# Patient Record
Sex: Female | Born: 1980 | Race: White | Hispanic: No | Marital: Married | Smoking: Never smoker
Health system: Southern US, Community
[De-identification: ages and names within clinical notes are randomized; demographics above are authoritative.]

## PROBLEM LIST (undated history)

## (undated) DIAGNOSIS — Z789 Other specified health status: Secondary | ICD-10-CM

## (undated) DIAGNOSIS — Z8619 Personal history of other infectious and parasitic diseases: Secondary | ICD-10-CM

## (undated) DIAGNOSIS — T7840XA Allergy, unspecified, initial encounter: Secondary | ICD-10-CM

## (undated) DIAGNOSIS — F419 Anxiety disorder, unspecified: Secondary | ICD-10-CM

## (undated) HISTORY — DX: Anxiety disorder, unspecified: F41.9

## (undated) HISTORY — DX: Personal history of other infectious and parasitic diseases: Z86.19

## (undated) HISTORY — PX: APPENDECTOMY: SHX54

## (undated) HISTORY — DX: Allergy, unspecified, initial encounter: T78.40XA

## (undated) HISTORY — PX: WISDOM TOOTH EXTRACTION: SHX21

## (undated) HISTORY — PX: TUBAL LIGATION: SHX77

---

## 2008-03-05 ENCOUNTER — Emergency Department (HOSPITAL_COMMUNITY): Admission: EM | Admit: 2008-03-05 | Discharge: 2008-03-05 | Payer: Self-pay | Admitting: Emergency Medicine

## 2011-04-29 LAB — POCT RAPID STREP A: Streptococcus, Group A Screen (Direct): NEGATIVE

## 2011-05-17 LAB — OB RESULTS CONSOLE HEPATITIS B SURFACE ANTIGEN: Hepatitis B Surface Ag: NEGATIVE

## 2011-05-17 LAB — OB RESULTS CONSOLE RPR: RPR: NONREACTIVE

## 2011-05-17 LAB — OB RESULTS CONSOLE RUBELLA ANTIBODY, IGM: Rubella: IMMUNE

## 2011-05-17 LAB — OB RESULTS CONSOLE HIV ANTIBODY (ROUTINE TESTING): HIV: NONREACTIVE

## 2011-05-17 LAB — OB RESULTS CONSOLE ABO/RH: RH Type: POSITIVE

## 2011-09-05 ENCOUNTER — Other Ambulatory Visit: Payer: Self-pay

## 2011-09-05 LAB — OB RESULTS CONSOLE RPR: RPR: NONREACTIVE

## 2011-09-06 ENCOUNTER — Other Ambulatory Visit (HOSPITAL_COMMUNITY): Payer: Self-pay | Admitting: Obstetrics and Gynecology

## 2011-09-06 DIAGNOSIS — Q672 Dolichocephaly: Secondary | ICD-10-CM

## 2011-09-07 ENCOUNTER — Ambulatory Visit (HOSPITAL_COMMUNITY)
Admission: RE | Admit: 2011-09-07 | Discharge: 2011-09-07 | Disposition: A | Discharge status: Home / Self Care | Payer: BC Managed Care – PPO | Source: Ambulatory Visit | Attending: Obstetrics and Gynecology | Admitting: Obstetrics and Gynecology

## 2011-09-07 ENCOUNTER — Encounter (HOSPITAL_COMMUNITY): Payer: Self-pay

## 2011-09-07 DIAGNOSIS — Q672 Dolichocephaly: Secondary | ICD-10-CM

## 2011-09-07 DIAGNOSIS — Z1389 Encounter for screening for other disorder: Secondary | ICD-10-CM | POA: Insufficient documentation

## 2011-09-07 DIAGNOSIS — Z363 Encounter for antenatal screening for malformations: Secondary | ICD-10-CM | POA: Insufficient documentation

## 2011-09-07 DIAGNOSIS — O358XX0 Maternal care for other (suspected) fetal abnormality and damage, not applicable or unspecified: Secondary | ICD-10-CM | POA: Insufficient documentation

## 2011-11-27 ENCOUNTER — Inpatient Hospital Stay (HOSPITAL_COMMUNITY)
Admission: AD | Admit: 2011-11-27 | Discharge: 2011-12-01 | DRG: 371 | Disposition: A | Discharge status: Home / Self Care | Payer: BC Managed Care – PPO | Source: Ambulatory Visit | Attending: Obstetrics & Gynecology | Admitting: Obstetrics & Gynecology

## 2011-11-27 ENCOUNTER — Encounter (HOSPITAL_COMMUNITY): Payer: Self-pay | Admitting: *Deleted

## 2011-11-27 DIAGNOSIS — O99892 Other specified diseases and conditions complicating childbirth: Secondary | ICD-10-CM | POA: Diagnosis present

## 2011-11-27 DIAGNOSIS — Z22330 Carrier of Group B streptococcus: Secondary | ICD-10-CM

## 2011-11-27 HISTORY — DX: Other specified health status: Z78.9

## 2011-11-27 LAB — CBC
HCT: 37.8 % (ref 36.0–46.0)
Hemoglobin: 13.1 g/dL (ref 12.0–15.0)
MCV: 89.6 fL (ref 78.0–100.0)
RBC: 4.22 MIL/uL (ref 3.87–5.11)
RDW: 13.1 % (ref 11.5–15.5)
WBC: 15 10*3/uL — ABNORMAL HIGH (ref 4.0–10.5)

## 2011-11-27 MED ORDER — EPHEDRINE 5 MG/ML INJ
10.0000 mg | INTRAVENOUS | Status: DC | PRN
  Administered 2011-11-28: 10 mg via INTRAVENOUS
  Filled 2011-11-27: qty 4

## 2011-11-27 MED ORDER — PHENYLEPHRINE 40 MCG/ML (10ML) SYRINGE FOR IV PUSH (FOR BLOOD PRESSURE SUPPORT)
80.0000 ug | PREFILLED_SYRINGE | INTRAVENOUS | Status: DC | PRN
Start: 2011-11-27 — End: 2011-11-28

## 2011-11-27 MED ORDER — CITRIC ACID-SODIUM CITRATE 334-500 MG/5ML PO SOLN
30.0000 mL | ORAL | Status: DC | PRN
  Administered 2011-11-28: 30 mL via ORAL
  Filled 2011-11-27: qty 15

## 2011-11-27 MED ORDER — IBUPROFEN 600 MG PO TABS: 600.0000 mg | ORAL_TABLET | Freq: Four times a day (QID) | ORAL | Status: DC | PRN

## 2011-11-27 MED ORDER — PENICILLIN G POTASSIUM 5000000 UNITS IJ SOLR
2.5000 10*6.[IU] | INTRAVENOUS | Status: DC
  Administered 2011-11-28 (×4): 2.5 10*6.[IU] via INTRAVENOUS
  Filled 2011-11-27 (×7): qty 2.5

## 2011-11-27 MED ORDER — FLEET ENEMA 7-19 GM/118ML RE ENEM: 1.0000 | ENEMA | RECTAL | Status: DC | PRN

## 2011-11-27 MED ORDER — ONDANSETRON HCL 4 MG/2ML IJ SOLN: 4.0000 mg | Freq: Four times a day (QID) | INTRAMUSCULAR | Status: DC | PRN

## 2011-11-27 MED ORDER — PENICILLIN G POTASSIUM 5000000 UNITS IJ SOLR
5.0000 10*6.[IU] | Freq: Once | INTRAVENOUS | Status: AC
  Administered 2011-11-27: 5 10*6.[IU] via INTRAVENOUS
  Filled 2011-11-27: qty 5

## 2011-11-27 MED ORDER — LIDOCAINE HCL (PF) 1 % IJ SOLN
30.0000 mL | INTRAMUSCULAR | Status: DC | PRN
  Filled 2011-11-27: qty 30

## 2011-11-27 MED ORDER — LACTATED RINGERS IV SOLN
INTRAVENOUS | Status: DC
  Administered 2011-11-27 – 2011-11-28 (×3): 125 mL/h via INTRAVENOUS
  Administered 2011-11-28 (×2): via INTRAVENOUS

## 2011-11-27 MED ORDER — ACETAMINOPHEN 325 MG PO TABS
650.0000 mg | ORAL_TABLET | ORAL | Status: DC | PRN
  Administered 2011-11-28: 650 mg via ORAL
  Filled 2011-11-27: qty 2

## 2011-11-27 MED ORDER — OXYTOCIN 20 UNITS IN LACTATED RINGERS INFUSION - SIMPLE: 125.0000 mL/h | Freq: Once | INTRAVENOUS | Status: DC

## 2011-11-27 MED ORDER — BUTORPHANOL TARTRATE 2 MG/ML IJ SOLN: 1.0000 mg | INTRAMUSCULAR | Status: DC | PRN

## 2011-11-27 MED ORDER — LIDOCAINE HCL (PF) 1 % IJ SOLN
INTRAMUSCULAR | Status: DC | PRN
  Administered 2011-11-27 (×2): 5 mL

## 2011-11-27 MED ORDER — FENTANYL 2.5 MCG/ML BUPIVACAINE 1/10 % EPIDURAL INFUSION (WH - ANES)
14.0000 mL/h | INTRAMUSCULAR | Status: DC
  Administered 2011-11-27 – 2011-11-28 (×5): 14 mL/h via EPIDURAL
  Filled 2011-11-27 (×5): qty 60

## 2011-11-27 MED ORDER — DIPHENHYDRAMINE HCL 50 MG/ML IJ SOLN: 12.5000 mg | INTRAMUSCULAR | Status: DC | PRN

## 2011-11-27 MED ORDER — OXYTOCIN BOLUS FROM INFUSION
500.0000 mL | Freq: Once | INTRAVENOUS | Status: DC
  Filled 2011-11-27: qty 500

## 2011-11-27 MED ORDER — PHENYLEPHRINE 40 MCG/ML (10ML) SYRINGE FOR IV PUSH (FOR BLOOD PRESSURE SUPPORT)
80.0000 ug | PREFILLED_SYRINGE | INTRAVENOUS | Status: DC | PRN
  Administered 2011-11-28: 80 ug via INTRAVENOUS
  Filled 2011-11-27: qty 5

## 2011-11-27 MED ORDER — OXYCODONE-ACETAMINOPHEN 5-325 MG PO TABS: 1.0000 | ORAL_TABLET | ORAL | Status: DC | PRN

## 2011-11-27 MED ORDER — LACTATED RINGERS IV SOLN
500.0000 mL | INTRAVENOUS | Status: DC | PRN
Start: 2011-11-27 — End: 2011-11-28
  Administered 2011-11-28: 500 mL via INTRAVENOUS

## 2011-11-27 MED ORDER — LACTATED RINGERS IV SOLN
500.0000 mL | Freq: Once | INTRAVENOUS | Status: AC
  Administered 2011-11-27: 500 mL via INTRAVENOUS
  Administered 2011-11-28 (×2): via INTRAVENOUS

## 2011-11-27 MED ORDER — EPHEDRINE 5 MG/ML INJ
10.0000 mg | INTRAVENOUS | Status: DC | PRN
  Administered 2011-11-28: 10 mg via INTRAVENOUS

## 2011-11-27 NOTE — H&P (Signed)
  31 y.o. G1P0  Estimated Date of Delivery: 11/28/11 admitted at 39/[redacted] weeks gestation in labor. Prenatal course was uncomplicated. Prenatal labs: Blood Type:AB+.  Screening tests for HIV, Syphilis, Hepatitis B, Rubella sensitivity, fetal anomalies, and gestational diabetes were negative.  Perineal group B strep colonization was present.    Afebrile, VSS Heart and Lungs: No active disease Abdomen: soft, gravid, EFW AGA. Cervical exam:  3.5/90, Vtx -2 (per admission nurse).  Impression: Term Pregnancy, early labor  Plan:  TOL; GBS prophylaxis

## 2011-11-27 NOTE — Anesthesia Preprocedure Evaluation (Addendum)
Anesthesia Evaluation  Patient identified by MRN, date of birth, ID band Patient awake    Reviewed: Allergy & Precautions, H&P , Patient's Chart, lab work & pertinent test results  Airway Mallampati: II TM Distance: >3 FB Neck ROM: full    Dental No notable dental hx.    Pulmonary neg pulmonary ROS,  breath sounds clear to auscultation  Pulmonary exam normal       Cardiovascular negative cardio ROS  Rhythm:regular Rate:Normal     Neuro/Psych negative neurological ROS  negative psych ROS   GI/Hepatic negative GI ROS, Neg liver ROS,   Endo/Other  negative endocrine ROS  Renal/GU negative Renal ROS     Musculoskeletal   Abdominal   Peds  Hematology negative hematology ROS (+)   Anesthesia Other Findings   Reproductive/Obstetrics (+) Pregnancy                           Anesthesia Physical Anesthesia Plan  ASA: II and Emergent  Anesthesia Plan: Epidural   Post-op Pain Management:    Induction:   Airway Management Planned: Natural Airway  Additional Equipment:   Intra-op Plan:   Post-operative Plan:   Informed Consent: I have reviewed the patients History and Physical, chart, labs and discussed the procedure including the risks, benefits and alternatives for the proposed anesthesia with the patient or authorized representative who has indicated his/her understanding and acceptance.     Plan Discussed with:   Anesthesia Plan Comments:        Anesthesia Quick Evaluation

## 2011-11-27 NOTE — MAU Note (Signed)
Pt reports uc's q 5 min for 4 hours

## 2011-11-27 NOTE — Anesthesia Procedure Notes (Signed)
Epidural Patient location during procedure: OB Start time: 11/27/2011 11:36 PM  Staffing Anesthesiologist: Brayton Caves R Performed by: anesthesiologist   Preanesthetic Checklist Completed: patient identified, site marked, surgical consent, pre-op evaluation, timeout performed, IV checked, risks and benefits discussed and monitors and equipment checked  Epidural Patient position: sitting Prep: site prepped and draped and DuraPrep Patient monitoring: continuous pulse ox and blood pressure Approach: midline Injection technique: LOR air and LOR saline  Needle:  Needle type: Tuohy  Needle gauge: 17 G Needle length: 9 cm Needle insertion depth: 5 cm cm Catheter type: closed end flexible Catheter size: 19 Gauge Catheter at skin depth: 10 cm Test dose: negative  Assessment Events: blood not aspirated, injection not painful, no injection resistance, negative IV test and no paresthesia  Additional Notes Patient identified.  Risk benefits discussed including failed block, incomplete pain control, headache, nerve damage, paralysis, blood pressure changes, nausea, vomiting, reactions to medication both toxic or allergic, and postpartum back pain.  Patient expressed understanding and wished to proceed.  All questions were answered.  Sterile technique used throughout procedure and epidural site dressed with sterile barrier dressing. No paresthesia or other complications noted.The patient did not experience any signs of intravascular injection such as tinnitus or metallic taste in mouth nor signs of intrathecal spread such as rapid motor block. Please see nursing notes for vital signs.

## 2011-11-28 ENCOUNTER — Encounter (HOSPITAL_COMMUNITY)
Admission: AD | Disposition: A | Discharge status: Home / Self Care | Payer: Self-pay | Source: Ambulatory Visit | Attending: Obstetrics & Gynecology

## 2011-11-28 ENCOUNTER — Encounter (HOSPITAL_COMMUNITY): Payer: Self-pay | Admitting: *Deleted

## 2011-11-28 ENCOUNTER — Encounter (HOSPITAL_COMMUNITY): Payer: Self-pay | Admitting: Anesthesiology

## 2011-11-28 ENCOUNTER — Inpatient Hospital Stay (HOSPITAL_COMMUNITY): Payer: BC Managed Care – PPO | Admitting: Anesthesiology

## 2011-11-28 SURGERY — Surgical Case
Anesthesia: Epidural | Site: Abdomen | Wound class: Clean Contaminated

## 2011-11-28 MED ORDER — OXYCODONE-ACETAMINOPHEN 5-325 MG PO TABS
1.0000 | ORAL_TABLET | ORAL | Status: DC | PRN
  Administered 2011-11-30 – 2011-12-01 (×3): 1 via ORAL
  Filled 2011-11-28 (×3): qty 1

## 2011-11-28 MED ORDER — MEPERIDINE HCL 25 MG/ML IJ SOLN
6.2500 mg | INTRAMUSCULAR | Status: DC | PRN
  Administered 2011-11-28: 6.25 mg via INTRAVENOUS

## 2011-11-28 MED ORDER — FENTANYL CITRATE 0.05 MG/ML IJ SOLN: 25.0000 ug | INTRAMUSCULAR | Status: DC | PRN

## 2011-11-28 MED ORDER — NALOXONE HCL 0.4 MG/ML IJ SOLN: 0.4000 mg | INTRAMUSCULAR | Status: DC | PRN

## 2011-11-28 MED ORDER — ONDANSETRON HCL 4 MG/2ML IJ SOLN: 4.0000 mg | Freq: Three times a day (TID) | INTRAMUSCULAR | Status: DC | PRN

## 2011-11-28 MED ORDER — DIPHENHYDRAMINE HCL 50 MG/ML IJ SOLN: 12.5000 mg | INTRAMUSCULAR | Status: DC | PRN

## 2011-11-28 MED ORDER — NALBUPHINE HCL 10 MG/ML IJ SOLN: 5.0000 mg | INTRAMUSCULAR | Status: DC | PRN

## 2011-11-28 MED ORDER — OXYTOCIN 20 UNITS IN LACTATED RINGERS INFUSION - SIMPLE: 125.0000 mL/h | INTRAVENOUS | Status: AC

## 2011-11-28 MED ORDER — SODIUM BICARBONATE 8.4 % IV SOLN
INTRAVENOUS | Status: DC | PRN
  Administered 2011-11-28: 5 mL via EPIDURAL

## 2011-11-28 MED ORDER — SODIUM BICARBONATE 8.4 % IV SOLN
INTRAVENOUS | Status: AC
  Filled 2011-11-28: qty 50

## 2011-11-28 MED ORDER — ONDANSETRON HCL 4 MG/2ML IJ SOLN
INTRAMUSCULAR | Status: DC | PRN
  Administered 2011-11-28: 4 mg via INTRAVENOUS

## 2011-11-28 MED ORDER — SENNOSIDES-DOCUSATE SODIUM 8.6-50 MG PO TABS
2.0000 | ORAL_TABLET | Freq: Every day | ORAL | Status: DC
  Administered 2011-11-28 – 2011-11-30 (×3): 2 via ORAL

## 2011-11-28 MED ORDER — PRENATAL MULTIVITAMIN CH
1.0000 | ORAL_TABLET | Freq: Every day | ORAL | Status: DC
  Administered 2011-11-29 – 2011-12-01 (×3): 1 via ORAL
  Filled 2011-11-28 (×3): qty 1

## 2011-11-28 MED ORDER — METOCLOPRAMIDE HCL 5 MG/ML IJ SOLN: 10.0000 mg | Freq: Three times a day (TID) | INTRAMUSCULAR | Status: DC | PRN

## 2011-11-28 MED ORDER — MORPHINE SULFATE 0.5 MG/ML IJ SOLN
INTRAMUSCULAR | Status: AC
  Filled 2011-11-28: qty 10

## 2011-11-28 MED ORDER — MEPERIDINE HCL 25 MG/ML IJ SOLN
INTRAMUSCULAR | Status: AC
  Filled 2011-11-28: qty 1

## 2011-11-28 MED ORDER — ONDANSETRON HCL 4 MG/2ML IJ SOLN: 4.0000 mg | INTRAMUSCULAR | Status: DC | PRN

## 2011-11-28 MED ORDER — KETOROLAC TROMETHAMINE 30 MG/ML IJ SOLN
30.0000 mg | Freq: Four times a day (QID) | INTRAMUSCULAR | Status: AC | PRN
  Administered 2011-11-28: 30 mg via INTRAVENOUS

## 2011-11-28 MED ORDER — LIDOCAINE-EPINEPHRINE (PF) 2 %-1:200000 IJ SOLN
INTRAMUSCULAR | Status: AC
  Filled 2011-11-28: qty 20

## 2011-11-28 MED ORDER — MENTHOL 3 MG MT LOZG: 1.0000 | LOZENGE | OROMUCOSAL | Status: DC | PRN

## 2011-11-28 MED ORDER — SODIUM CHLORIDE 0.9 % IJ SOLN: 3.0000 mL | INTRAMUSCULAR | Status: DC | PRN

## 2011-11-28 MED ORDER — KETOROLAC TROMETHAMINE 30 MG/ML IJ SOLN
INTRAMUSCULAR | Status: AC
  Filled 2011-11-28: qty 1

## 2011-11-28 MED ORDER — KETOROLAC TROMETHAMINE 30 MG/ML IJ SOLN: 30.0000 mg | Freq: Four times a day (QID) | INTRAMUSCULAR | Status: AC | PRN

## 2011-11-28 MED ORDER — SCOPOLAMINE 1 MG/3DAYS TD PT72
1.0000 | MEDICATED_PATCH | Freq: Once | TRANSDERMAL | Status: DC
  Administered 2011-11-28: 1.5 mg via TRANSDERMAL

## 2011-11-28 MED ORDER — TETANUS-DIPHTH-ACELL PERTUSSIS 5-2.5-18.5 LF-MCG/0.5 IM SUSP
0.5000 mL | Freq: Once | INTRAMUSCULAR | Status: AC
  Administered 2011-11-30: 0.5 mL via INTRAMUSCULAR
  Filled 2011-11-28: qty 0.5

## 2011-11-28 MED ORDER — DIPHENHYDRAMINE HCL 50 MG/ML IJ SOLN: 25.0000 mg | INTRAMUSCULAR | Status: DC | PRN

## 2011-11-28 MED ORDER — OXYTOCIN 20 UNITS IN LACTATED RINGERS INFUSION - SIMPLE
INTRAVENOUS | Status: AC
  Filled 2011-11-28: qty 1000

## 2011-11-28 MED ORDER — TERBUTALINE SULFATE 1 MG/ML IJ SOLN: 0.2500 mg | Freq: Once | INTRAMUSCULAR | Status: DC | PRN

## 2011-11-28 MED ORDER — LACTATED RINGERS IV SOLN
INTRAVENOUS | Status: DC
  Administered 2011-11-29 (×2): via INTRAVENOUS

## 2011-11-28 MED ORDER — CEFAZOLIN SODIUM-DEXTROSE 2-3 GM-% IV SOLR
2.0000 g | Freq: Once | INTRAVENOUS | Status: AC
  Administered 2011-11-28 (×2): 2 g via INTRAVENOUS
  Filled 2011-11-28: qty 50

## 2011-11-28 MED ORDER — SIMETHICONE 80 MG PO CHEW
80.0000 mg | CHEWABLE_TABLET | Freq: Three times a day (TID) | ORAL | Status: DC
  Administered 2011-11-28 – 2011-12-01 (×10): 80 mg via ORAL

## 2011-11-28 MED ORDER — DIBUCAINE 1 % RE OINT: 1.0000 "application " | TOPICAL_OINTMENT | RECTAL | Status: DC | PRN

## 2011-11-28 MED ORDER — WITCH HAZEL-GLYCERIN EX PADS: 1.0000 "application " | MEDICATED_PAD | CUTANEOUS | Status: DC | PRN

## 2011-11-28 MED ORDER — ZOLPIDEM TARTRATE 5 MG PO TABS: 5.0000 mg | ORAL_TABLET | Freq: Every evening | ORAL | Status: DC | PRN

## 2011-11-28 MED ORDER — IBUPROFEN 600 MG PO TABS: 600.0000 mg | ORAL_TABLET | Freq: Four times a day (QID) | ORAL | Status: DC | PRN

## 2011-11-28 MED ORDER — ONDANSETRON HCL 4 MG PO TABS: 4.0000 mg | ORAL_TABLET | ORAL | Status: DC | PRN

## 2011-11-28 MED ORDER — MORPHINE SULFATE (PF) 0.5 MG/ML IJ SOLN
INTRAMUSCULAR | Status: DC | PRN
  Administered 2011-11-28: 1 mg via INTRAVENOUS

## 2011-11-28 MED ORDER — MORPHINE SULFATE (PF) 0.5 MG/ML IJ SOLN
INTRAMUSCULAR | Status: DC | PRN
  Administered 2011-11-28: 4 mg via EPIDURAL

## 2011-11-28 MED ORDER — OXYTOCIN 10 UNIT/ML IJ SOLN
INTRAMUSCULAR | Status: DC | PRN
  Administered 2011-11-28: 20 [IU] via INTRAMUSCULAR

## 2011-11-28 MED ORDER — ONDANSETRON HCL 4 MG/2ML IJ SOLN
INTRAMUSCULAR | Status: AC
  Filled 2011-11-28: qty 2

## 2011-11-28 MED ORDER — LANOLIN HYDROUS EX OINT: 1.0000 "application " | TOPICAL_OINTMENT | CUTANEOUS | Status: DC | PRN

## 2011-11-28 MED ORDER — OXYTOCIN 20 UNITS IN LACTATED RINGERS INFUSION - SIMPLE
1.0000 m[IU]/min | INTRAVENOUS | Status: DC
  Administered 2011-11-28: 2 m[IU]/min via INTRAVENOUS
  Filled 2011-11-28: qty 1000

## 2011-11-28 MED ORDER — OXYTOCIN 10 UNIT/ML IJ SOLN
INTRAMUSCULAR | Status: AC
  Filled 2011-11-28: qty 4

## 2011-11-28 MED ORDER — DIPHENHYDRAMINE HCL 25 MG PO CAPS: 25.0000 mg | ORAL_CAPSULE | ORAL | Status: DC | PRN

## 2011-11-28 MED ORDER — SODIUM CHLORIDE 0.9 % IV SOLN: 1.0000 ug/kg/h | INTRAVENOUS | Status: DC | PRN

## 2011-11-28 MED ORDER — SIMETHICONE 80 MG PO CHEW: 80.0000 mg | CHEWABLE_TABLET | ORAL | Status: DC | PRN

## 2011-11-28 MED ORDER — SCOPOLAMINE 1 MG/3DAYS TD PT72
MEDICATED_PATCH | TRANSDERMAL | Status: AC
  Filled 2011-11-28: qty 1

## 2011-11-28 MED ORDER — IBUPROFEN 600 MG PO TABS
600.0000 mg | ORAL_TABLET | Freq: Four times a day (QID) | ORAL | Status: DC
  Administered 2011-11-29 – 2011-12-01 (×10): 600 mg via ORAL
  Filled 2011-11-28 (×10): qty 1

## 2011-11-28 MED ORDER — CITRIC ACID-SODIUM CITRATE 334-500 MG/5ML PO SOLN: 30.0000 mL | Freq: Once | ORAL | Status: DC

## 2011-11-28 MED ORDER — DIPHENHYDRAMINE HCL 25 MG PO CAPS: 25.0000 mg | ORAL_CAPSULE | Freq: Four times a day (QID) | ORAL | Status: DC | PRN

## 2011-11-28 SURGICAL SUPPLY — 38 items
CLOTH BEACON ORANGE TIMEOUT ST (SAFETY) ×2 IMPLANT
DRESSING TELFA 8X3 (GAUZE/BANDAGES/DRESSINGS) IMPLANT
DRSG COVADERM 4X10 (GAUZE/BANDAGES/DRESSINGS) ×2 IMPLANT
ELECT REM PT RETURN 9FT ADLT (ELECTROSURGICAL) ×2
ELECTRODE REM PT RTRN 9FT ADLT (ELECTROSURGICAL) ×1 IMPLANT
EXTRACTOR VACUUM M CUP 4 TUBE (SUCTIONS) IMPLANT
GAUZE SPONGE 4X4 12PLY STRL LF (GAUZE/BANDAGES/DRESSINGS) ×2 IMPLANT
GLOVE BIO SURGEON STRL SZ7.5 (GLOVE) ×4 IMPLANT
GLOVE BIOGEL PI IND STRL 7.0 (GLOVE) ×1 IMPLANT
GLOVE BIOGEL PI IND STRL 8 (GLOVE) ×1 IMPLANT
GLOVE BIOGEL PI INDICATOR 7.0 (GLOVE) ×1
GLOVE BIOGEL PI INDICATOR 8 (GLOVE) ×1
GLOVE SS BIOGEL STRL SZ 7 (GLOVE) ×1 IMPLANT
GLOVE SUPERSENSE BIOGEL SZ 7 (GLOVE) ×1
GLOVE SURG SS PI 7.5 STRL IVOR (GLOVE) ×2 IMPLANT
GLOVE SURG SS PI 8.0 STRL IVOR (GLOVE) ×2 IMPLANT
GOWN PREVENTION PLUS LG XLONG (DISPOSABLE) ×4 IMPLANT
GOWN PREVENTION PLUS XLARGE (GOWN DISPOSABLE) ×4 IMPLANT
KIT ABG SYR 3ML LUER SLIP (SYRINGE) IMPLANT
NEEDLE HYPO 25X5/8 SAFETYGLIDE (NEEDLE) IMPLANT
NS IRRIG 1000ML POUR BTL (IV SOLUTION) ×2 IMPLANT
PACK C SECTION WH (CUSTOM PROCEDURE TRAY) ×2 IMPLANT
PAD ABD 7.5X8 STRL (GAUZE/BANDAGES/DRESSINGS) IMPLANT
SLEEVE SCD COMPRESS KNEE MED (MISCELLANEOUS) ×2 IMPLANT
STAPLER VISISTAT 35W (STAPLE) ×2 IMPLANT
SUT PLAIN 1 NONE 54 (SUTURE) ×2 IMPLANT
SUT PROLENE 0 TP 1 60 (SUTURE) IMPLANT
SUT VIC AB 0 CT1 27 (SUTURE) ×2
SUT VIC AB 0 CT1 27XBRD ANBCTR (SUTURE) ×2 IMPLANT
SUT VIC AB 1 CTX 36 (SUTURE) ×4
SUT VIC AB 1 CTX36XBRD ANBCTRL (SUTURE) ×4 IMPLANT
SUT VIC AB 3-0 SH 27 (SUTURE)
SUT VIC AB 3-0 SH 27X BRD (SUTURE) IMPLANT
SUT VICRYL 4-0 PS2 18IN ABS (SUTURE) ×2 IMPLANT
TAPE CLOTH SURG 4X10 WHT LF (GAUZE/BANDAGES/DRESSINGS) ×2 IMPLANT
TOWEL OR 17X24 6PK STRL BLUE (TOWEL DISPOSABLE) ×4 IMPLANT
TRAY FOLEY CATH 14FR (SET/KITS/TRAYS/PACK) ×2 IMPLANT
WATER STERILE IRR 1000ML POUR (IV SOLUTION) ×2 IMPLANT

## 2011-11-28 NOTE — Anesthesia Postprocedure Evaluation (Signed)
Anesthesia Post Note  Patient: Stacy Burton  Procedure(s) Performed: Procedure(s) (LRB): CESAREAN SECTION (N/A)  Anesthesia type: Epidural  Patient location: pacu   Post pain: Pain level controlled  Post assessment: Post-op Vital signs reviewed  Last Vitals:  Filed Vitals:   11/28/11 1900  BP:   Pulse:   Temp: 37.2 C  Resp:     Post vital signs: Reviewed  Level of consciousness: awake  Complications: No apparent anesthesia complications

## 2011-11-28 NOTE — Progress Notes (Addendum)
Dr. Jackson notified of maternal BP. Orders received. Will continue to monitor.  

## 2011-11-28 NOTE — Progress Notes (Signed)
Dr. Arlyce Dice notified of pt status, UC pattern, and SVE. Orders received. Will continue to monitor.

## 2011-11-28 NOTE — Progress Notes (Signed)
FH reassuring.  Cx to me 3-4 and vtx does not fit Cx well.  Vtx at -1-2.  AROM, clear fluid, and IUPC placed to appropriately dose pitocin and monitor labor.

## 2011-11-28 NOTE — Progress Notes (Signed)
Has been pushing about 1 3/4 hours and caput at +2+3 but vtx at best +1.  Fh reassuring.  Patient is exhausted and wants C/section for delivery and based on exam, may be best choice.  I cannot attempt a VE because of vtx at +1.  Have posted C/section and am waiting on OR.  Pitocin off.

## 2011-11-28 NOTE — Transfer of Care (Signed)
Immediate Anesthesia Transfer of Care Note  Patient: Stacy Burton  Procedure(s) Performed: Procedure(s) (LRB): CESAREAN SECTION (N/A)  Patient Location: PACU  Anesthesia Type: Epidural  Level of Consciousness: awake  Airway & Oxygen Therapy: Patient Spontanous Breathing  Post-op Assessment: Report given to PACU RN and Post -op Vital signs reviewed and stable  Post vital signs: stable  Complications: No apparent anesthesia complications

## 2011-11-28 NOTE — Op Note (Addendum)
Preoperative diagnoses-  #1-term pregnancy, active labor, arrest in second stage of labor  Postop diagnoses-  Same plus delivery of viable female infant, weight pending, Apgars 9 and 9.  Procedure-  Primary low transverse cesarean section  Surgeon-Dr. Aldona Bar  Asst.-Dr. Henderson Cloud  Anesthesia-augmented epidural  History - this 31 year old primigravida at term was admitted on the evening of 4/28 in early labor and had a somewhat prolonged latent phase. Pitocin was begun in the early morning of 4-29 and when I assumed her care she was about 3-4 cm dilated with the vertex at -1 to -2 station. She underwent amniotomy with placement of a IUPC and Pitocin was continued. Thereafter she progressed and became fully dilated at approximately 3 PM on 4-29. The vertex at that time was still at about 0 station. She pushed for almost 2 hours and with pushing there was a significant caput noted that was palpable at +2 to +3 station although the vertex was no lower than +1 station. The patient at this time was becoming very exhausted and her pushing efforts were becoming very difficult for her. The presentation of the baby prohibited an operative vaginal delivery and the patient ultimately because of maternal exhaustion requested delivery by cesarean section which I concured with as I believed was going to come to C-section even if she pushed another hour because of nondecent of the vertex. C-section was scheduled and patient did receive 2 g of Ancef IV preoperatively.  Operative procedure-once in the operating room with the epidural  augmented and a Foley catheter  already in place, a timeout was carried out and thereafter patient was prepped and draped in the usual fashion and procedure was begun.  A Pfannenstiel incision was made and with minimal difficulty dissected down to the fascia which was also incised in low transverse fashion. Hemostasis was created at each layer. Subfascial space was created inferiorly  and superiorly muscle separated in midline and peritoneum was identified and entered with care taken to avoid the bowel superiorly the bladder inferiorly. At this time the bladder blade was placed and the vesicouterine peritoneum was incised in low transverse fashion and incision into the uterus in low transverse fashion was then made with the Metzenbaum scissors and extended laterally with fingers. As expected, the vertex was deep in the pelvis but was able to be lifted out and ultimately a viable female infant was delivered which cried spontaneously once after the cord was clamped and cut it was passed off to the waiting team. Apgars were noted to be 9 and 9. Weight is still pending. The baby was brought back to bond with the mother and father in the operating room while the operative procedure was being continued.  The placenta was delivered intact after cord bloods were collected and thereafter the cavity was rendered free of any remaining products of conception. Good uterine contractility was affordable slowly given intravenous Pitocin and manual stimulation.  The uterus was not exteriorized. The uterine incision was carefully looked at and there was an extension down almost to the cervix on the patient's right which was closed using #1 Vicryl in a running locking fashion. Once this extension was closed it was fairly easy to thereafter closed the major part of the incision in the usual fashion of 2 layers-the first being a running locking #1 Vicryl and the second being an imbricating #1 Vicryl. Incision was carefully inspected noted to be dry. The uterus is well contracted.    At this time the abdomen was  lavaged all free blood and clot. The urine was noted to be clearing-there was some blood in the urine just after the delivery.  All counts were correct at this time and no foreign bodies were noted to be remaining in the abdominal cavity.  At this time closure of the abdomen was begun in layers. The  abdominal peritoneum was closed with 0 Vicryl in a running fashion and muscles were secured the same. Assured of good subfascial hemostasis the fascia was then reapproximated with 0 Vicryl from angle to midline bilaterally. Subcutaneous tissue was then rendered hemostatic and skin was closed with staples. A sterile pressure dressing was applied and the patient was ultimately taken to the recovery room in good condition having tolerated the procedure well.  Counts correct again.  In summary, this patient developed an arrest of the second stage and was taken to the operating room for cesarean section for delivery. A viable female infant with Apgars of 9 and 9 was delivered-weight still pending-and at the conclusion of the procedure both mother and baby were doing well in the respective recovery areas.

## 2011-11-29 ENCOUNTER — Encounter (HOSPITAL_COMMUNITY): Payer: Self-pay | Admitting: Obstetrics & Gynecology

## 2011-11-29 LAB — CBC
HCT: 29 % — ABNORMAL LOW (ref 36.0–46.0)
Hemoglobin: 9.8 g/dL — ABNORMAL LOW (ref 12.0–15.0)
MCH: 30.7 pg (ref 26.0–34.0)
MCHC: 33.8 g/dL (ref 30.0–36.0)
MCV: 90.9 fL (ref 78.0–100.0)
RBC: 3.19 MIL/uL — ABNORMAL LOW (ref 3.87–5.11)

## 2011-11-29 NOTE — Anesthesia Postprocedure Evaluation (Signed)
  Anesthesia Post-op Note  Patient: Stacy Burton  Procedure(s) Performed: Procedure(s) (LRB): CESAREAN SECTION (N/A)  Patient Location: PACU and Mother/Baby  Anesthesia Type: Epidural  Level of Consciousness: awake, alert  and oriented  Airway and Oxygen Therapy: Patient Spontanous Breathing  Post-op Pain: mild  Post-op Assessment: Post-op Vital signs reviewed  Post-op Vital Signs: Reviewed and stable  Complications: No apparent anesthesia complications

## 2011-11-29 NOTE — Addendum Note (Signed)
Addendum  created 11/29/11 1156 by Armanda Heritage, RN   Modules edited:Notes Section

## 2011-11-29 NOTE — Progress Notes (Signed)
Post Op Day 1 Subjective: no complaints  Objective: Blood pressure 95/56, pulse 78, temperature 99.4 F (37.4 C), temperature source Oral, resp. rate 18, height 5\' 10"  (1.778 m), weight 211 lb 12.8 oz (96.072 kg), breastfeeding.  Physical Exam:  General: alert Lochia: appropriate Uterine Fundus: firm Incision: no significant drainage   Basename 11/29/11 0530 11/27/11 2255  HGB 9.8* 13.1  HCT 29.0* 37.8    Assessment/Plan: Doing well.  Continue hospital observation   LOS: 2 days   Armistead Sult D 11/29/2011, 9:06 AM

## 2011-11-30 NOTE — Progress Notes (Signed)
Subjective: Postpartum Day 1: Cesarean Delivery Patient reports incisional pain, tolerating PO and + flatus.    Objective: Vital signs in last 24 hours: Filed Vitals:   11/29/11 1434 11/29/11 1815 11/29/11 2134 11/30/11 0530  BP: 95/57 103/60 105/61 105/68  Pulse: 76 71 89 78  Temp: 98.2 F (36.8 C) 98.2 F (36.8 C) 98 F (36.7 C) 98.4 F (36.9 C)  TempSrc: Oral Oral Oral Oral  Resp: 18 18 18 18   Height:      Weight:      SpO2: 96% 98%      Physical Exam:  General: alert, cooperative and appears stated age 31: appropriate Uterine Fundus: firm Incision: healing well DVT Evaluation: No evidence of DVT seen on physical exam.   Basename 11/29/11 0530 11/27/11 2255  HGB 9.8* 13.1  HCT 29.0* 37.8    Assessment/Plan: Status post Cesarean section. Doing well postoperatively.  Continue current care.   Toma Erichsen H. 11/30/2011, 8:33 AM

## 2011-11-30 NOTE — Anesthesia Postprocedure Evaluation (Signed)
  Anesthesia Post-op Note  Patient: Stacy Burton  Procedure(s) Performed: Procedure(s) (LRB): CESAREAN SECTION (N/A)  Patient Location: PACU  Anesthesia Type: Spinal  Level of Consciousness: awake, alert  and oriented  Airway and Oxygen Therapy: Patient Spontanous Breathing  Post-op Pain: none  Post-op Assessment: Post-op Vital signs reviewed and Patient's Cardiovascular Status Stable  Post-op Vital Signs: Reviewed and stable  Complications: No apparent anesthesia complications

## 2011-11-30 NOTE — Addendum Note (Signed)
Addendum  created 11/30/11 1358 by Orlie Pollen, CRNA   Modules edited:Notes Section

## 2011-12-01 MED ORDER — OXYCODONE-ACETAMINOPHEN 5-325 MG PO TABS: 1.0000 | ORAL_TABLET | ORAL | Status: AC | PRN

## 2011-12-01 MED ORDER — IBUPROFEN 600 MG PO TABS: 600.0000 mg | ORAL_TABLET | Freq: Four times a day (QID) | ORAL | Status: AC

## 2011-12-01 NOTE — Discharge Summary (Signed)
Obstetric Discharge Summary Reason for Admission: onset of labor Prenatal Procedures: none Intrapartum Procedures: cesarean: low cervical, transverse Postpartum Procedures: none Complications-Operative and Postpartum: none Hemoglobin  Date Value Range Status  11/29/2011 9.8* 12.0-15.0 (g/dL) Final     REPEATED TO VERIFY     DELTA CHECK NOTED     HCT  Date Value Range Status  11/29/2011 29.0* 36.0-46.0 (%) Final    Physical Exam:  General: alert and cooperative Lochia: appropriate Uterine Fundus: firm Incision: healing well, no significant drainage, no dehiscence, no significant erythema DVT Evaluation: No evidence of DVT seen on physical exam.  Discharge Diagnoses: Term Pregnancy-delivered  Discharge Information: Date: 12/01/2011 Activity: pelvic rest Diet: routine Medications: PNV, Ibuprofen and Percocet Condition: stable Instructions: refer to practice specific booklet Discharge to: home Follow-up Information    Follow up with Caprice Beaver, MD in 4 weeks.   Contact information:   [address] [place] [address] [phone]          Newborn Data: Live born female  Birth Weight: 8 lb 7.5 oz (3840 g) APGAR: 9, 9  Home with mother.  Philip Aspen 12/01/2011, 9:27 AM

## 2011-12-06 ENCOUNTER — Other Ambulatory Visit: Payer: Self-pay | Admitting: Obstetrics and Gynecology

## 2011-12-06 DIAGNOSIS — N63 Unspecified lump in unspecified breast: Secondary | ICD-10-CM

## 2011-12-09 ENCOUNTER — Ambulatory Visit
Admission: RE | Admit: 2011-12-09 | Discharge: 2011-12-09 | Disposition: A | Discharge status: Home / Self Care | Payer: BC Managed Care – PPO | Source: Ambulatory Visit | Attending: Obstetrics and Gynecology | Admitting: Obstetrics and Gynecology

## 2011-12-09 DIAGNOSIS — N63 Unspecified lump in unspecified breast: Secondary | ICD-10-CM

## 2012-10-09 IMAGING — US US OB DETAIL+14 WK
1 series · 14 of 28 positions shown · non-contrast
Comparison: none

[Series 1: us ob detail+14 wk · 0.19mm/px · 14 of 96 slices shown]
[im 4/96]
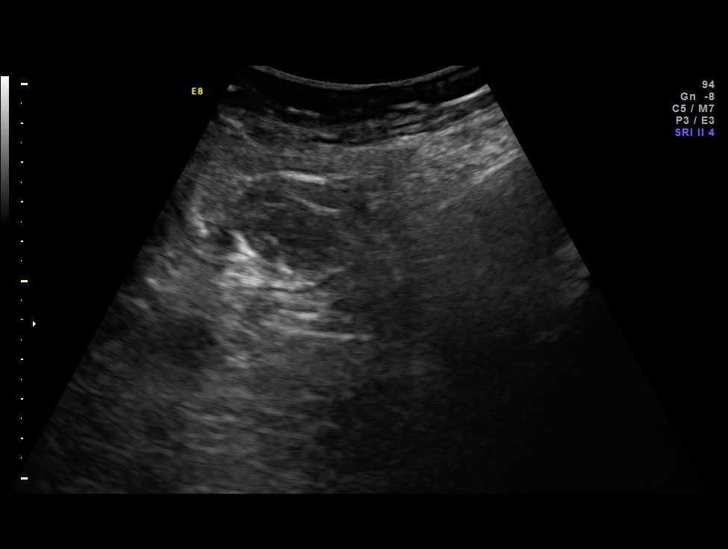
[im 11/96]
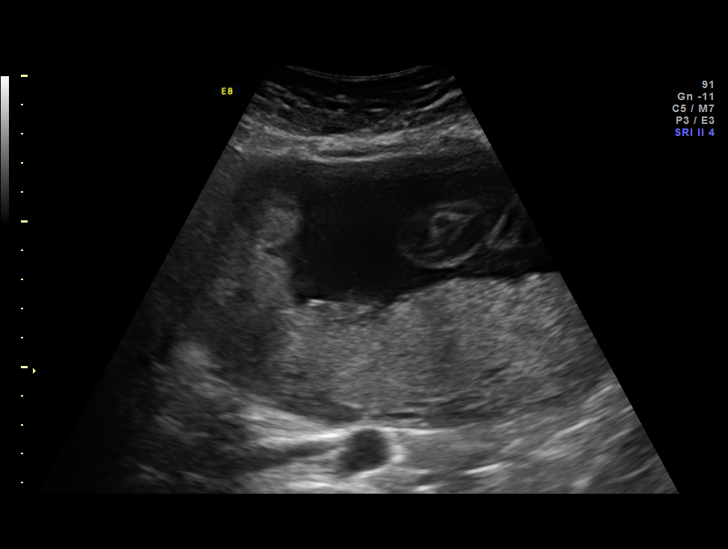
[im 18/96]
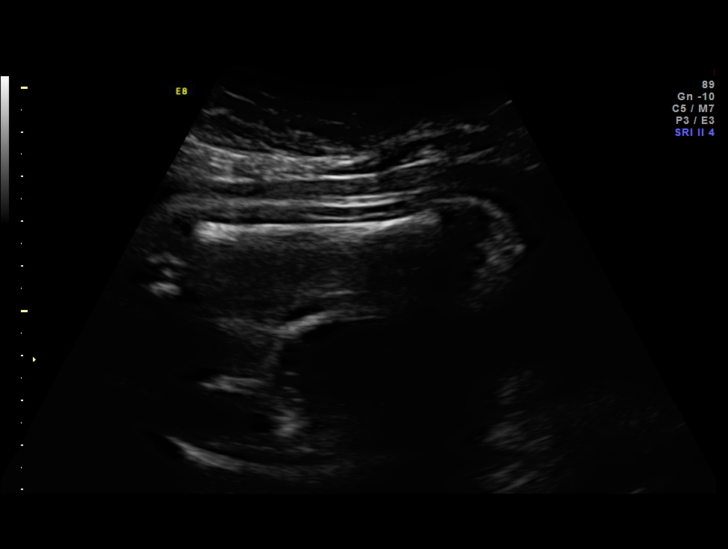
[im 25/96]
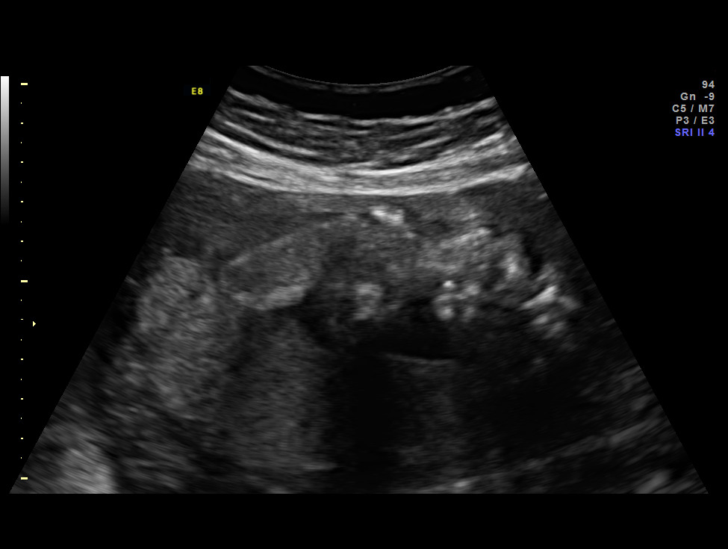
[im 32/96]
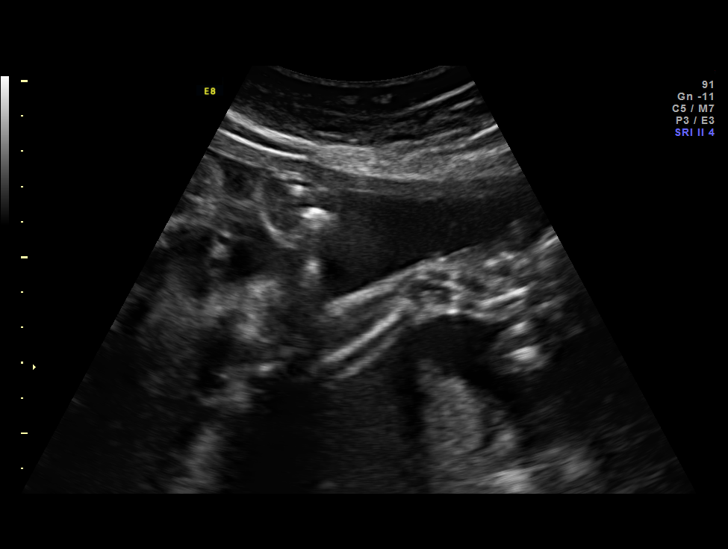
[im 39/96]
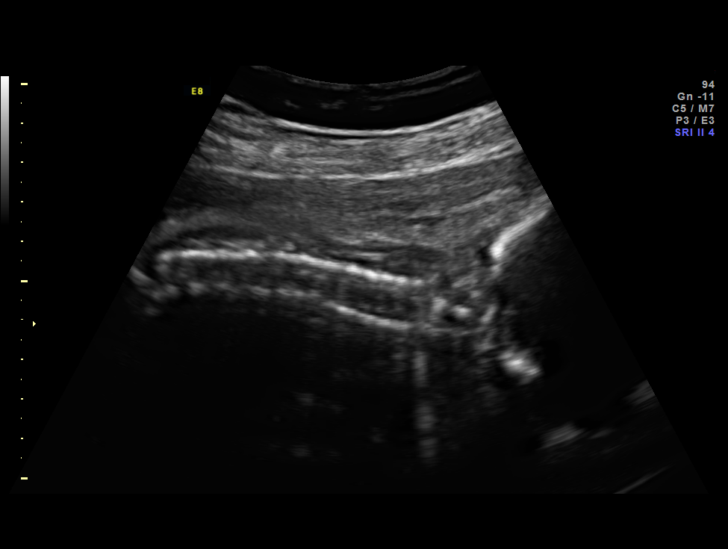
[im 46/96]
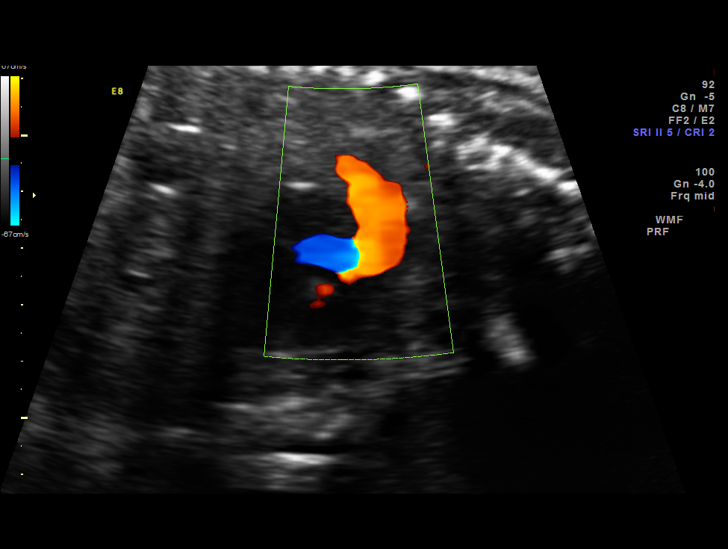
[im 53/96]
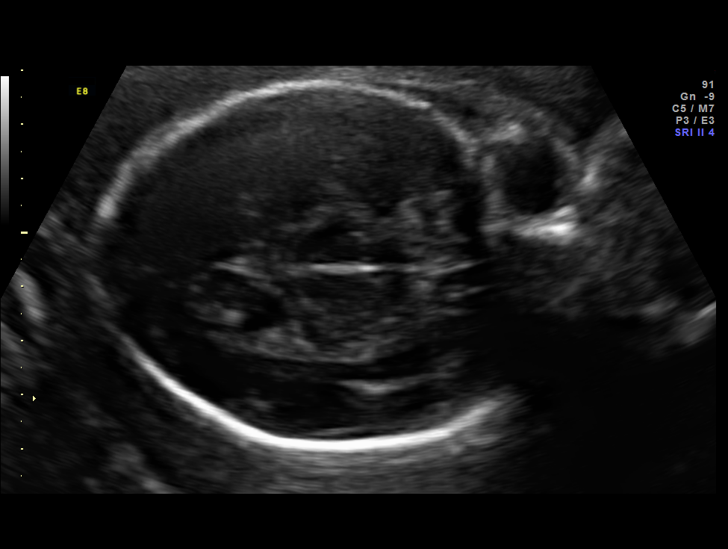
[im 60/96]
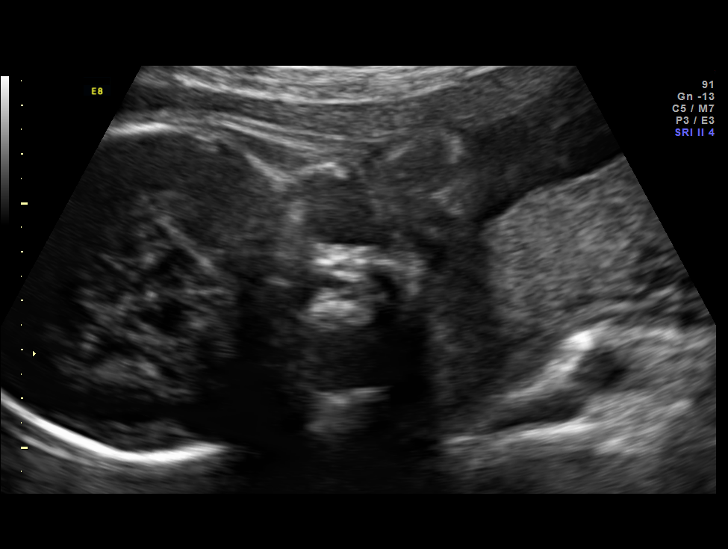
[im 67/96]
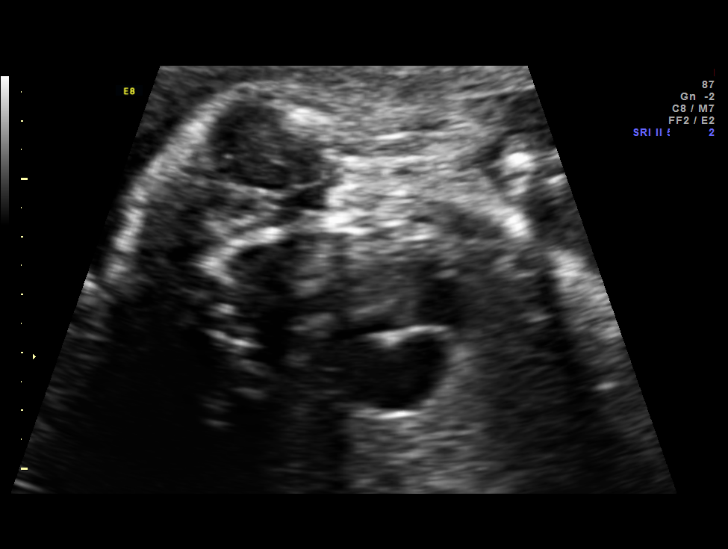
[im 74/96]
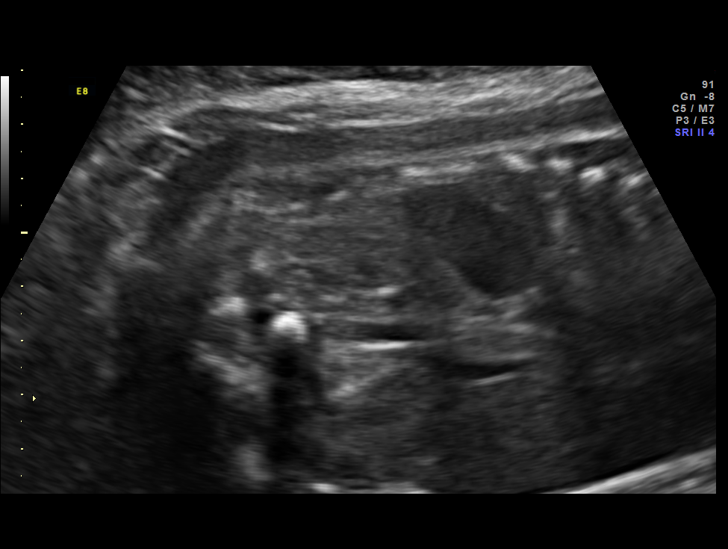
[im 81/96]
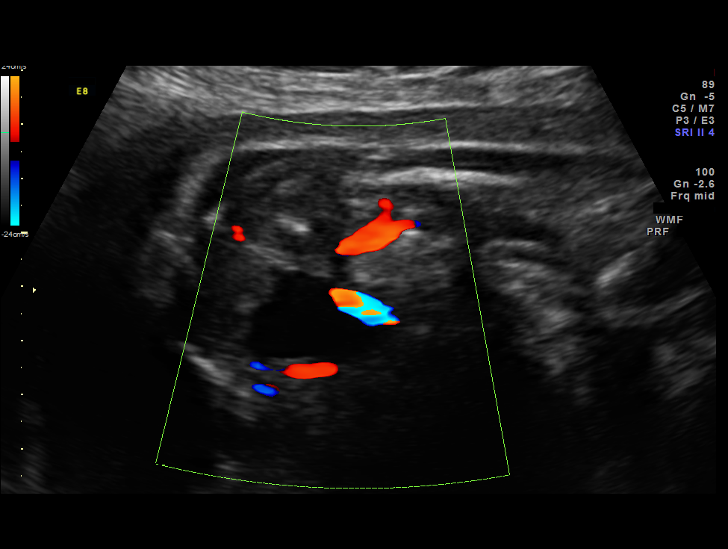
[im 88/96]
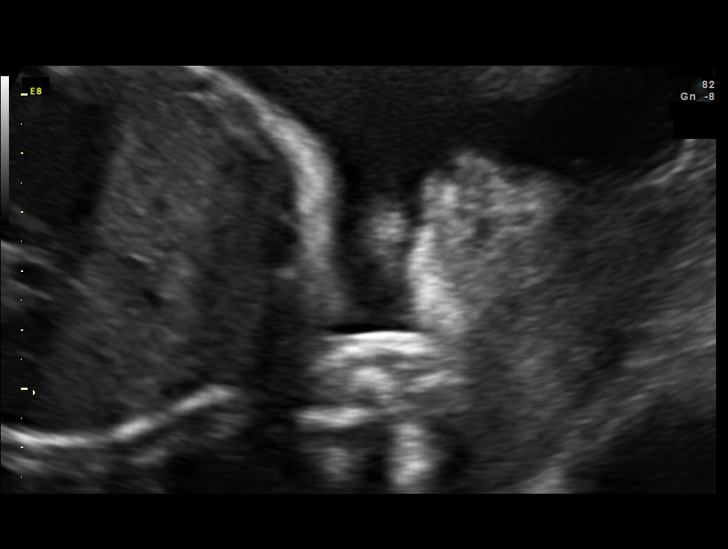
[im 96/96]
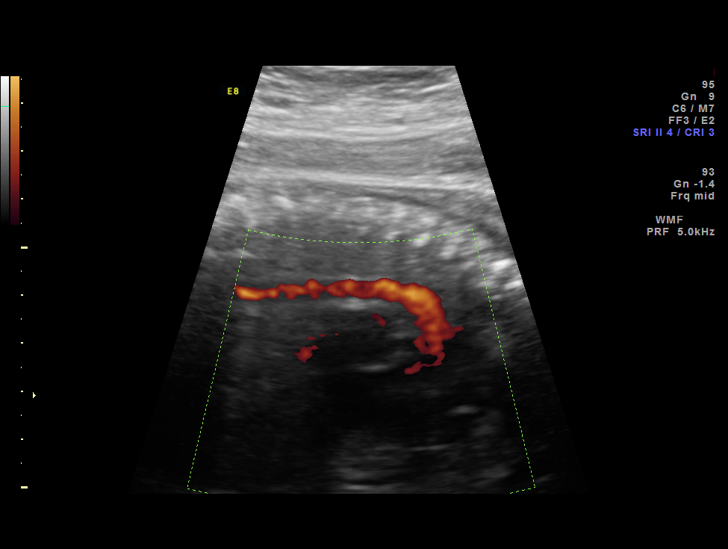

[14 of 28 positions shown; findings below may reference images not displayed]

Canned report from images found in remote index.

Refer to host system for actual result text.

## 2013-05-24 ENCOUNTER — Ambulatory Visit: Payer: BC Managed Care – PPO | Admitting: Family Medicine

## 2013-06-10 ENCOUNTER — Ambulatory Visit (INDEPENDENT_AMBULATORY_CARE_PROVIDER_SITE_OTHER): Payer: BC Managed Care – PPO | Admitting: Internal Medicine

## 2013-06-10 ENCOUNTER — Encounter: Payer: Self-pay | Admitting: Internal Medicine

## 2013-06-10 VITALS — BP 104/64 | HR 68 | Temp 98.2°F | Ht 70.5 in | Wt 196.8 lb

## 2013-06-10 DIAGNOSIS — Z1329 Encounter for screening for other suspected endocrine disorder: Secondary | ICD-10-CM

## 2013-06-10 DIAGNOSIS — Z0000 Encounter for general adult medical examination without abnormal findings: Secondary | ICD-10-CM

## 2013-06-10 DIAGNOSIS — Z13220 Encounter for screening for lipoid disorders: Secondary | ICD-10-CM

## 2013-06-10 LAB — COMPREHENSIVE METABOLIC PANEL
ALT: 13 U/L (ref 0–35)
AST: 14 U/L (ref 0–37)
Alkaline Phosphatase: 55 U/L (ref 39–117)
BUN: 13 mg/dL (ref 6–23)
Chloride: 102 mEq/L (ref 96–112)
Creatinine, Ser: 0.8 mg/dL (ref 0.4–1.2)
Total Bilirubin: 0.5 mg/dL (ref 0.3–1.2)

## 2013-06-10 LAB — LIPID PANEL
Cholesterol: 203 mg/dL — ABNORMAL HIGH (ref 0–200)
Total CHOL/HDL Ratio: 6
Triglycerides: 250 mg/dL — ABNORMAL HIGH (ref 0.0–149.0)
VLDL: 50 mg/dL — ABNORMAL HIGH (ref 0.0–40.0)

## 2013-06-10 LAB — CBC
HCT: 39.6 % (ref 36.0–46.0)
Hemoglobin: 13.5 g/dL (ref 12.0–15.0)
MCHC: 34.1 g/dL (ref 30.0–36.0)
MCV: 87.8 fl (ref 78.0–100.0)
RDW: 12.7 % (ref 11.5–14.6)

## 2013-06-10 LAB — TSH: TSH: 0.5 u[IU]/mL (ref 0.35–5.50)

## 2013-06-10 NOTE — Progress Notes (Signed)
HPI  Pt presents to the clinic today to establish care. She is transferring care from [facility]. She has never had a physical. She has no concerns today.  Flu: never Tetanus: 2010 LMP: 06/08/2013 Pap Smear: 06/2012 Dentist: 2014  Past Medical History  Diagnosis Date  . No pertinent past medical history   . Allergy     Current Outpatient Prescriptions  Medication Sig Dispense Refill  . cetirizine (ZYRTEC) 10 MG tablet Take 10 mg by mouth daily.      . mometasone (NASONEX) 50 MCG/ACT nasal spray Place 2 sprays into the nose daily as needed.      Marland Kitchen PRENATAL VITAMINS PO Take by mouth.      . vitamin C (ASCORBIC ACID) 500 MG tablet Take 500 mg by mouth daily.      . chlorpheniramine (CHLOR-TRIMETON) 4 MG tablet Take 4 mg by mouth 2 (two) times daily as needed. For allergies       No current facility-administered medications for this visit.    No Known Allergies  Family History  Problem Relation Age of Onset  . Anesthesia problems Neg Hx   . Depression Sister   . Mental retardation Sister   . Depression Maternal Grandfather   . Hypothyroidism Mother   . Cancer Mother     uterine cancer  . Hyperlipidemia Father     History   Social History  . Marital Status: Married    Spouse Name: N/A    Number of Children: N/A  . Years of Education: N/A   Occupational History  . Not on file.   Social History Main Topics  . Smoking status: Never Smoker   . Smokeless tobacco: Not on file  . Alcohol Use: No  . Drug Use: No  . Sexual Activity: Yes    Birth Control/ Protection: None   Other Topics Concern  . Not on file   Social History Narrative  . No narrative on file    ROS:  Constitutional: Denies fever, malaise, fatigue, headache or abrupt weight changes.  HEENT: Denies eye pain, eye redness, ear pain, ringing in the ears, wax buildup, runny nose, nasal congestion, bloody nose, or sore throat. Respiratory: Denies difficulty breathing, shortness of breath,  cough or sputum production.   Cardiovascular: Denies chest pain, chest tightness, palpitations or swelling in the hands or feet.  Gastrointestinal: Denies abdominal pain, bloating, constipation, diarrhea or blood in the stool.  GU: Denies frequency, urgency, pain with urination, blood in urine, odor or discharge. Musculoskeletal: Denies decrease in range of motion, difficulty with gait, muscle pain or joint pain and swelling.  Skin: Denies redness, rashes, lesions or ulcercations.  Neurological: Denies dizziness, difficulty with memory, difficulty with speech or problems with balance and coordination.   No other specific complaints in a complete review of systems (except as listed in HPI above).  PE:  BP 104/64  Pulse 68  Temp(Src) 98.2 F (36.8 C) (Oral)  Ht 5' 10.5" (1.791 m)  Wt 196 lb 12 oz (89.245 kg)  BMI 27.82 kg/m2  LMP 05/18/2013 Wt Readings from Last 3 Encounters:  06/10/13 196 lb 12 oz (89.245 kg)  11/27/11 211 lb 12.8 oz (96.072 kg)  11/27/11 211 lb 12.8 oz (96.072 kg)    General: Appears her stated age, overweight but well developed, well nourished in NAD. HEENT: Head: normal shape and size; Eyes: sclera white, no icterus, conjunctiva pink, PERRLA and EOMs intact; Ears: Tm's gray and intact, normal light reflex; Nose: mucosa pink  and moist, septum midline; Throat/Mouth: Teeth present, mucosa pink and moist, no lesions or ulcerations noted.  Neck: Normal range of motion. Neck supple, trachea midline. No massses, lumps or thyromegaly present.  Cardiovascular: Normal rate and rhythm. S1,S2 noted.  No murmur, rubs or gallops noted. No JVD or BLE edema. No carotid bruits noted. Pulmonary/Chest: Normal effort and positive vesicular breath sounds. No respiratory distress. No wheezes, rales or ronchi noted.  Abdomen: Soft and nontender. Normal bowel sounds, no bruits noted. No distention or masses noted. Liver, spleen and kidneys non palpable. Musculoskeletal: Normal range of  motion. No signs of joint swelling. No difficulty with gait.  Neurological: Alert and oriented. Cranial nerves II-XII intact. Coordination normal. +DTRs bilaterally. Psychiatric: Mood and affect normal. Behavior is normal. Judgment and thought content normal.      Assessment and Plan:  Preventative Health Maintenance:  Pt declines flu vaccine today Will obtain screening lab work Advised pt to continue to work on diet and exercise to decrease her BMI  RTC in 1 year or sooner if needed

## 2013-06-10 NOTE — Progress Notes (Signed)
Pre-visit discussion using our clinic review tool. No additional management support is needed unless otherwise documented below in the visit note.  

## 2013-06-10 NOTE — Progress Notes (Signed)
HPI: Pt presents to office today to establish care. Pt is transferring from [facility]. She denies any concerns at this time.  LMP: 05/18/2013 Pap smear: 2013 Flu Shot: refused Tetanus: 2010 Eye Exam: 2014 Dentist: 2014  Past Medical History  Diagnosis Date  . No pertinent past medical history   . Allergy     Current Outpatient Prescriptions  Medication Sig Dispense Refill  . cetirizine (ZYRTEC) 10 MG tablet Take 10 mg by mouth daily.      . mometasone (NASONEX) 50 MCG/ACT nasal spray Place 2 sprays into the nose daily as needed.      Marland Kitchen PRENATAL VITAMINS PO Take by mouth.      . vitamin C (ASCORBIC ACID) 500 MG tablet Take 500 mg by mouth daily.      . chlorpheniramine (CHLOR-TRIMETON) 4 MG tablet Take 4 mg by mouth 2 (two) times daily as needed. For allergies       No current facility-administered medications for this visit.    No Known Allergies  Family History  Problem Relation Age of Onset  . Anesthesia problems Neg Hx   . Depression Sister   . Mental retardation Sister   . Depression Maternal Grandfather   . Hypothyroidism Mother   . Cancer Mother     uterine cancer  . Hyperlipidemia Father     History   Social History  . Marital Status: Married    Spouse Name: N/A    Number of Children: N/A  . Years of Education: N/A   Occupational History  . Not on file.   Social History Main Topics  . Smoking status: Never Smoker   . Smokeless tobacco: Not on file  . Alcohol Use: No  . Drug Use: No  . Sexual Activity: Yes    Birth Control/ Protection: None   Other Topics Concern  . Not on file   Social History Narrative  . No narrative on file    ROS:  Constitutional: Denies fever, malaise, fatigue, headache or abrupt weight changes.  HEENT: Denies eye pain, eye redness, ear pain, ringing in the ears, wax buildup, runny nose, nasal congestion, bloody nose, or sore throat. Respiratory: Denies difficulty breathing, shortness of breath, cough or  sputum production.   Cardiovascular: Denies chest pain, chest tightness, palpitations or swelling in the hands or feet.  Gastrointestinal: Denies abdominal pain, bloating, constipation, diarrhea or blood in the stool.  GU: Denies frequency, urgency, pain with urination, blood in urine, odor or discharge. Musculoskeletal: Denies decrease in range of motion, difficulty with gait, muscle pain or joint pain and swelling.  Skin: Denies redness, rashes, lesions or ulcercations.  Neurological: Denies dizziness, difficulty with memory, difficulty with speech or problems with balance and coordination.   No other specific complaints in a complete review of systems (except as listed in HPI above).  PE:  BP 104/64  Pulse 68  Temp(Src) 98.2 F (36.8 C) (Oral)  Ht 5' 10.5" (1.791 m)  Wt 196 lb 12 oz (89.245 kg)  BMI 27.82 kg/m2  LMP 05/18/2013 Wt Readings from Last 3 Encounters:  06/10/13 196 lb 12 oz (89.245 kg)  11/27/11 211 lb 12.8 oz (96.072 kg)  11/27/11 211 lb 12.8 oz (96.072 kg)    General: Appears their stated age, well developed, well nourished in NAD. HEENT: Head: normal shape and size; Eyes: sclera white, no icterus, conjunctiva pink, PERRLA and EOMs intact; Ears: Tm's gray and intact, normal light reflex; Nose: mucosa pink and moist, septum midline; Throat/Mouth:  Teeth present, mucosa pink and moist, no lesions or ulcerations noted.  Neck: Normal range of motion. Neck supple, trachea midline. No massses, lumps or thyromegaly present.  Cardiovascular: Normal rate and rhythm. S1,S2 noted.  No murmur, rubs or gallops noted. No JVD or BLE edema. No carotid bruits noted. Pulmonary/Chest: Normal effort and positive vesicular breath sounds. No respiratory distress. No wheezes, rales or ronchi noted.  Abdomen: Soft and nontender. Normal bowel sounds, no bruits noted. No distention or masses noted. Liver, spleen and kidneys non palpable. Musculoskeletal: Normal range of motion. No signs of joint  swelling. No difficulty with gait.  Neurological: Alert and oriented. Cranial nerves II-XII intact. Coordination normal. +DTRs bilaterally. Psychiatric: Mood and affect normal. Behavior is normal. Judgment and thought content normal.   Assessment and Plan: Health Maintenance: Check lipid panel, CBC, CMET, and TSH Will call with results Follow up in one year for wellness exam  Phylis Javed, Jacques Earthly, Student-NP

## 2013-06-10 NOTE — Patient Instructions (Signed)

## 2013-12-11 ENCOUNTER — Other Ambulatory Visit: Payer: Self-pay | Admitting: Internal Medicine

## 2013-12-11 DIAGNOSIS — E785 Hyperlipidemia, unspecified: Secondary | ICD-10-CM

## 2013-12-12 ENCOUNTER — Other Ambulatory Visit (INDEPENDENT_AMBULATORY_CARE_PROVIDER_SITE_OTHER): Payer: BC Managed Care – PPO

## 2013-12-12 DIAGNOSIS — E785 Hyperlipidemia, unspecified: Secondary | ICD-10-CM

## 2013-12-12 LAB — LIPID PANEL
Cholesterol: 164 mg/dL (ref 0–200)
HDL: 39.7 mg/dL (ref >39.00)
LDL Cholesterol: 104 mg/dL — ABNORMAL HIGH (ref 0–99)
TRIGLYCERIDES: 102 mg/dL (ref 0.0–149.0)
Total CHOL/HDL Ratio: 4
VLDL: 20.4 mg/dL (ref 0.0–40.0)

## 2013-12-19 ENCOUNTER — Ambulatory Visit (INDEPENDENT_AMBULATORY_CARE_PROVIDER_SITE_OTHER): Payer: BC Managed Care – PPO | Admitting: Internal Medicine

## 2013-12-19 ENCOUNTER — Encounter: Payer: Self-pay | Admitting: Internal Medicine

## 2013-12-19 VITALS — BP 106/62 | HR 76 | Temp 98.1°F | Wt 191.0 lb

## 2013-12-19 DIAGNOSIS — J029 Acute pharyngitis, unspecified: Secondary | ICD-10-CM

## 2013-12-19 DIAGNOSIS — B9789 Other viral agents as the cause of diseases classified elsewhere: Secondary | ICD-10-CM

## 2013-12-19 DIAGNOSIS — J028 Acute pharyngitis due to other specified organisms: Principal | ICD-10-CM

## 2013-12-19 LAB — POCT RAPID STREP A (OFFICE): Rapid Strep A Screen: NEGATIVE

## 2013-12-19 NOTE — Addendum Note (Signed)
Addended by: Roena MaladyEVONTENNO, Jakye Mullens Y on: 12/19/2013 03:19 PM   Modules accepted: Orders

## 2013-12-19 NOTE — Progress Notes (Signed)
Pre visit review using our clinic review tool, if applicable. No additional management support is needed unless otherwise documented below in the visit note. 

## 2013-12-19 NOTE — Progress Notes (Addendum)
HPI  Pt presents to the clinic today with c/o sore throat and chills.She reports this started 3 days ago. She also has some ear fullness but denies pain. She denies fever, chills or body aches. She does have a history of allergies. She has taken OTC zyrtec with little relief. She has had sick contacts.  Review of Systems      Past Medical History  Diagnosis Date  . No pertinent past medical history   . Allergy     Family History  Problem Relation Age of Onset  . Anesthesia problems Neg Hx   . Depression Sister   . Mental retardation Sister   . Depression Maternal Grandfather   . Hypothyroidism Mother   . Cancer Mother     uterine cancer  . Hyperlipidemia Father     History   Social History  . Marital Status: Married    Spouse Name: N/A    Number of Children: N/A  . Years of Education: N/A   Occupational History  . Not on file.   Social History Main Topics  . Smoking status: Never Smoker   . Smokeless tobacco: Not on file  . Alcohol Use: No  . Drug Use: No  . Sexual Activity: Yes    Birth Control/ Protection: None   Other Topics Concern  . Not on file   Social History Narrative  . No narrative on file    No Known Allergies   Constitutional:  Denies headache, fatigue, fever or abrupt weight changes.  HEENT:  Positive sore throat. Denies eye redness, eye pain, pressure behind the eyes, facial pain, nasal congestion, ear pain, ringing in the ears, wax buildup, runny nose or bloody nose. Respiratory: Denies cough, difficulty breathing or shortness of breath.  Cardiovascular: Denies chest pain, chest tightness, palpitations or swelling in the hands or feet.   No other specific complaints in a complete review of systems (except as listed in HPI above).  Objective:  BP 106/62  Pulse 76  Temp(Src) 98.1 F (36.7 C) (Oral)  Wt 191 lb (86.637 kg)  SpO2 98%  Wt Readings from Last 3 Encounters:  06/10/13 196 lb 12 oz (89.245 kg)  11/27/11 211 lb 12.8 oz  (96.072 kg)  11/27/11 211 lb 12.8 oz (96.072 kg)     General: Appears her stated age, well developed, well nourished in NAD. HEENT: Head: normal shape and size; Eyes: sclera white, no icterus, conjunctiva pink, PERRLA and EOMs intact; Ears: Tm's gray and intact, normal light reflex; Nose: mucosa pink and moist, septum midline; Throat/Mouth: + PND. Teeth present, mucosa erythematous and moist, no exudate noted, no lesions or ulcerations noted.  Neck:  Neck supple, trachea midline. No massses, lumps or thyromegaly present.  Cardiovascular: Normal rate and rhythm. S1,S2 noted.  No murmur, rubs or gallops noted. No JVD or BLE edema. No carotid bruits noted. Pulmonary/Chest: Normal effort and positive vesicular breath sounds. No respiratory distress. No wheezes, rales or ronchi noted.      Assessment & Plan:   Sore throat:  Get some rest and drink plenty of water Do salt water gargles for the sore throat RST: negative Ibuprofen for pain/inflammation  RTC as needed or if symptoms persist.

## 2013-12-19 NOTE — Patient Instructions (Addendum)
Sore Throat A sore throat is pain, burning, irritation, or scratchiness of the throat. There is often pain or tenderness when swallowing or talking. A sore throat may be accompanied by other symptoms, such as coughing, sneezing, fever, and swollen neck glands. A sore throat is often the first sign of another sickness, such as a cold, flu, strep throat, or mononucleosis (commonly known as mono). Most sore throats go away without medical treatment. CAUSES  The most common causes of a sore throat include:  A viral infection, such as a cold, flu, or mono.  A bacterial infection, such as strep throat, tonsillitis, or whooping cough.  Seasonal allergies.  Dryness in the air.  Irritants, such as smoke or pollution.  Gastroesophageal reflux disease (GERD). HOME CARE INSTRUCTIONS   Only take over-the-counter medicines as directed by your caregiver.  Drink enough fluids to keep your urine clear or pale yellow.  Rest as needed.  Try using throat sprays, lozenges, or sucking on hard candy to ease any pain (if older than 4 years or as directed).  Sip warm liquids, such as broth, herbal tea, or warm water with honey to relieve pain temporarily. You may also eat or drink cold or frozen liquids such as frozen ice pops.  Gargle with salt water (mix 1 tsp salt with 8 oz of water).  Do not smoke and avoid secondhand smoke.  Put a cool-mist humidifier in your bedroom at night to moisten the air. You can also turn on a hot shower and sit in the bathroom with the door closed for 5 10 minutes. SEEK IMMEDIATE MEDICAL CARE IF:  You have difficulty breathing.  You are unable to swallow fluids, soft foods, or your saliva.  You have increased swelling in the throat.  Your sore throat does not get better in 7 days.  You have nausea and vomiting.  You have a fever or persistent symptoms for more than 2 3 days.  You have a fever and your symptoms suddenly get worse. MAKE SURE YOU:   Understand  these instructions.  Will watch your condition.  Will get help right away if you are not doing well or get worse. Document Released: 08/25/2004 Document Revised: 07/04/2012 Document Reviewed: 03/25/2012 Exit[employer] Patient Information 2014 [employer].  

## 2014-06-02 ENCOUNTER — Encounter: Payer: Self-pay | Admitting: Internal Medicine

## 2014-06-04 LAB — OB RESULTS CONSOLE GC/CHLAMYDIA
Chlamydia: NEGATIVE
Gonorrhea: NEGATIVE

## 2014-07-08 LAB — OB RESULTS CONSOLE ANTIBODY SCREEN: ANTIBODY SCREEN: NEGATIVE

## 2014-07-08 LAB — OB RESULTS CONSOLE HIV ANTIBODY (ROUTINE TESTING): HIV: NONREACTIVE

## 2014-07-08 LAB — OB RESULTS CONSOLE ABO/RH: RH TYPE: POSITIVE

## 2014-07-08 LAB — OB RESULTS CONSOLE HEPATITIS B SURFACE ANTIGEN: HEP B S AG: NEGATIVE

## 2014-07-08 LAB — OB RESULTS CONSOLE RPR: RPR: NONREACTIVE

## 2014-07-08 LAB — OB RESULTS CONSOLE RUBELLA ANTIBODY, IGM: Rubella: IMMUNE

## 2014-08-08 ENCOUNTER — Inpatient Hospital Stay (HOSPITAL_COMMUNITY)
Admission: AD | Admit: 2014-08-08 | Payer: BC Managed Care – PPO | Source: Ambulatory Visit | Admitting: Obstetrics and Gynecology

## 2014-09-30 ENCOUNTER — Ambulatory Visit (INDEPENDENT_AMBULATORY_CARE_PROVIDER_SITE_OTHER): Payer: BC Managed Care – PPO | Admitting: Family Medicine

## 2014-09-30 ENCOUNTER — Encounter: Payer: Self-pay | Admitting: Family Medicine

## 2014-09-30 VITALS — BP 116/68 | HR 80 | Temp 98.3°F | Wt 209.5 lb

## 2014-09-30 DIAGNOSIS — H9202 Otalgia, left ear: Secondary | ICD-10-CM

## 2014-09-30 NOTE — Assessment & Plan Note (Signed)
With mid ear effusion/probable ETD. No indication of otitis at this point.  No cerumen on exam. Advised supportive care- use sudafed with caution- discussed pregnancy implications. Call or return to clinic prn if these symptoms worsen or fail to improve as anticipated. The patient indicates understanding of these issues and agrees with the plan.

## 2014-09-30 NOTE — Progress Notes (Signed)
Subjective:   Patient ID: Stacy Burton, female    DOB: 05-17-1981, 34 y.o.   MRN: [id number]  Stacy RippleDenise Oberhaus is a pleasant 34 y.o. year old female who is [redacted] weeks pregnant with twins presents to clinic today with Ear Pain  on 09/30/2014  HPI:  Husband and daughter had URI symptoms this past week.  She developed some but nothing severe.  Past two days, left ear pressure/pain. No ear drainage. No fevers. No decreased hearing.  Has not yet taken anything for it since she is pregnant.  Current Outpatient Prescriptions on File Prior to Visit  Medication Sig Dispense Refill  . cetirizine (ZYRTEC) 10 MG tablet Take 10 mg by mouth daily.    . mometasone (NASONEX) 50 MCG/ACT nasal spray Place 2 sprays into the nose daily as needed.    Marland Kitchen. PRENATAL VITAMINS PO Take by mouth.    . vitamin C (ASCORBIC ACID) 500 MG tablet Take 500 mg by mouth daily.     No current facility-administered medications on file prior to visit.    No Known Allergies  Past Medical History  Diagnosis Date  . No pertinent past medical history   . Allergy     Past Surgical History  Procedure Laterality Date  . Appendectomy    . Cesarean section  11/28/2011    Procedure: CESAREAN SECTION;  Surgeon: Annamaria Hellingobert Wein, MD;  Location: [facility];  Service: Gynecology;  Laterality: N/A;    Family History  Problem Relation Age of Onset  . Anesthesia problems Neg Hx   . Depression Sister   . Mental retardation Sister   . Depression Maternal Grandfather   . Hypothyroidism Mother   . Cancer Mother     uterine cancer  . Hyperlipidemia Father     History   Social History  . Marital Status: Married    Spouse Name: N/A  . Number of Children: N/A  . Years of Education: N/A   Occupational History  . Not on file.   Social History Main Topics  . Smoking status: Never Smoker   . Smokeless tobacco: Not on file  . Alcohol Use: No  . Drug Use: No  . Sexual Activity: Yes    Birth Control/ Protection: None   Other Topics  Concern  . Not on file   Social History Narrative   The PMH, PSH, Social History, Family History, Medications, and allergies [facility], and have been updated if relevant.     Review of Systems  Constitutional: Negative.   HENT: Positive for ear pain. Negative for dental problem, ear discharge, hearing loss, rhinorrhea, sinus pressure, sneezing, sore throat, tinnitus, trouble swallowing and voice change.   Respiratory: Negative.   Cardiovascular: Negative.   Neurological: Negative for dizziness.  All other systems reviewed and are negative.      Objective:    BP 116/68 mmHg  Pulse 80  Temp(Src) 98.3 F (36.8 C) (Oral)  Wt 209 lb 8 oz (95.029 kg)  SpO2 97%   Physical Exam  Constitutional: She appears well-developed and well-nourished. No distress.  HENT:  Head: Normocephalic and atraumatic.  Right Ear: Hearing, tympanic membrane, external ear and ear canal normal.  Left Ear: No lacerations. No drainage, swelling or tenderness. No foreign bodies. Tympanic membrane is not bulging. Tympanic membrane mobility is normal. A middle ear effusion is present.  Cardiovascular: Normal rate.   Pulmonary/Chest: Effort normal.  Abdominal:  Obviously pregnant abdomen  Skin: Skin is warm and dry.  Psychiatric:  She has a normal mood and affect. Her behavior is normal. Judgment and thought content normal.  Nursing note and vitals reviewed.         Assessment & Plan:   Left ear pain No Follow-up on file.

## 2014-09-30 NOTE — Patient Instructions (Addendum)
Good to see you. Try Sudafed as needed. Call me later this week if symptoms worsen.  CONGRATULATIONS!

## 2014-09-30 NOTE — Progress Notes (Signed)
Pre visit review using our clinic review tool, if applicable. No additional management support is needed unless otherwise documented below in the visit note. 

## 2014-10-10 ENCOUNTER — Encounter: Payer: Self-pay | Admitting: Family Medicine

## 2014-10-10 ENCOUNTER — Ambulatory Visit (INDEPENDENT_AMBULATORY_CARE_PROVIDER_SITE_OTHER): Payer: BC Managed Care – PPO | Admitting: Family Medicine

## 2014-10-10 VITALS — BP 100/66 | HR 83 | Temp 98.2°F | Ht 70.5 in | Wt 211.5 lb

## 2014-10-10 DIAGNOSIS — J069 Acute upper respiratory infection, unspecified: Secondary | ICD-10-CM | POA: Insufficient documentation

## 2014-10-10 NOTE — Progress Notes (Signed)
  Subjective:    Stacy RippleDenise Burton is a 34 y/o F at 3725? weeks gestation presenting with a productive cough, congestion, and feeling of fullness in the left ear of 2 weeks duration. She was seen in clinic on 3/1 and treated with Sudafed, which she experienced improvement with but discontinued after a few days. Her sx have been worsening over the past 2 days. She endorses a productive cough, which is worse with lying down, and states that her husband heard her wheezing last night. She also endorses a low grade fever of 99.3, occasional sore throat, and dull pain/feeling of fullness in the left ear. She denies SOB, chest pain, and has no hx of asthma. She does have a PMH of seasonal allergies, for which she takes Zyrtec. She states that she has had sick contacts through her work at an [employer].   Review of Systems No chest pain No SOB Endorses low grade fever    Objective:    Physical Exam  Constitutional: She appears well-developed. No distress.  HENT:  Head: Normocephalic.  Mouth/Throat: Oropharynx is clear and moist.  Pregnant female  Eyes: Conjunctivae and EOM are normal. Pupils are equal, round, and reactive to light. Right eye exhibits no discharge. Left eye exhibits no discharge.  MMM, mucus present behind left tympanic membrane, swelling/redness of nasal turbinates  Cardiovascular: Normal rate, regular rhythm and normal heart sounds.   Pulmonary/Chest: Effort normal and breath sounds normal.  Lymphadenopathy:    She has no cervical adenopathy.  Skin: Skin is warm and dry. No erythema.  Psychiatric: She has a normal mood and affect.      Assessment:   Stacy Burton is a 34 y/o presenting with a productive cough, left ear pain/fullness, and congestion concerning for an URI. Likely viral. Will advise symptomatic treatment. Plan:   Use mucinex to help clear out congestion, can also try using a neti pot to flush out mucus. Can also use nasonex (safe in pregnancy) to relieve  congestion, reduce nasal swelling Return if sx worsen or do not improve    Charles Schwablexis Amedio Bowlby served as scribe for Dr. Ermalene SearingBedsole  Pt seen and examined, reviewed note indetail. Agree with exam and care plan.  Kerby NoraAmy Bedsole MD

## 2014-10-10 NOTE — Patient Instructions (Signed)
Mucinex 600 mg twice daily x 1 week  Sudafed as needed for congestion x 1 week  Nasal saline spray 2-3 times daily. Restart nasonex 1-2 sprays daily. Call if not improving as expected or new fever or unilateral face pain/increase in ear pain.

## 2014-10-10 NOTE — Assessment & Plan Note (Signed)
Symptom care. 

## 2014-10-10 NOTE — Progress Notes (Signed)
Pre visit review using our clinic review tool, if applicable. No additional management support is needed unless otherwise documented below in the visit note. 

## 2014-12-31 ENCOUNTER — Encounter (HOSPITAL_COMMUNITY): Payer: Self-pay

## 2015-01-01 ENCOUNTER — Encounter (HOSPITAL_COMMUNITY): Payer: Self-pay

## 2015-01-01 ENCOUNTER — Encounter (HOSPITAL_COMMUNITY)
Admission: RE | Admit: 2015-01-01 | Discharge: 2015-01-01 | Disposition: A | Discharge status: Home / Self Care | Payer: BC Managed Care – PPO | Source: Ambulatory Visit | Attending: Obstetrics and Gynecology | Admitting: Obstetrics and Gynecology

## 2015-01-01 HISTORY — DX: Other specified health status: Z78.9

## 2015-01-01 LAB — CBC
HEMATOCRIT: 35.1 % — AB (ref 36.0–46.0)
HEMOGLOBIN: 12.3 g/dL (ref 12.0–15.0)
MCH: 31.1 pg (ref 26.0–34.0)
MCHC: 35 g/dL (ref 30.0–36.0)
MCV: 88.6 fL (ref 78.0–100.0)
PLATELETS: 189 10*3/uL (ref 150–400)
RBC: 3.96 MIL/uL (ref 3.87–5.11)
RDW: 13.5 % (ref 11.5–15.5)
WBC: 9.5 10*3/uL (ref 4.0–10.5)

## 2015-01-01 LAB — ABO/RH: ABO/RH(D): AB POS

## 2015-01-01 NOTE — Patient Instructions (Addendum)
Your procedure is scheduled on:01/02/15  Enter through the Main Entrance at :730 am  Pick up desk phone and dial [phone] and inform us of your arrival.  Please call [phone] if you have any problems the morning of surgery.  Remember: Do not eat food or drink liquids after midnight:tonight Water ok until:5am Friday   You may brush your teeth the morning of surgery.   DO NOT wear jewelry, eye make-up, lipstick,body lotion, or dark fingernail polish.  (Polished toes are ok) You may wear deodorant.  If you are to be admitted after surgery, leave suitcase in car until your room has been assigned. Patients discharged on the day of surgery will not be allowed to drive home. Wear loose fitting, comfortable clothes for your ride home.

## 2015-01-02 ENCOUNTER — Inpatient Hospital Stay (HOSPITAL_COMMUNITY): Payer: BC Managed Care – PPO | Admitting: Anesthesiology

## 2015-01-02 ENCOUNTER — Inpatient Hospital Stay (HOSPITAL_COMMUNITY)
Admission: RE | Admit: 2015-01-02 | Discharge: 2015-01-04 | DRG: 765 | Disposition: A | Discharge status: Home / Self Care | Payer: BC Managed Care – PPO | Source: Ambulatory Visit | Attending: Obstetrics and Gynecology | Admitting: Obstetrics and Gynecology

## 2015-01-02 ENCOUNTER — Encounter (HOSPITAL_COMMUNITY)
Admission: RE | Disposition: A | Discharge status: Home / Self Care | Payer: Self-pay | Source: Ambulatory Visit | Attending: Obstetrics and Gynecology

## 2015-01-02 ENCOUNTER — Encounter (HOSPITAL_COMMUNITY): Payer: Self-pay | Admitting: Anesthesiology

## 2015-01-02 DIAGNOSIS — E669 Obesity, unspecified: Secondary | ICD-10-CM | POA: Diagnosis present

## 2015-01-02 DIAGNOSIS — K219 Gastro-esophageal reflux disease without esophagitis: Secondary | ICD-10-CM | POA: Diagnosis present

## 2015-01-02 DIAGNOSIS — Z302 Encounter for sterilization: Secondary | ICD-10-CM | POA: Diagnosis not present

## 2015-01-02 DIAGNOSIS — Z3A38 38 weeks gestation of pregnancy: Secondary | ICD-10-CM | POA: Diagnosis present

## 2015-01-02 DIAGNOSIS — O99214 Obesity complicating childbirth: Secondary | ICD-10-CM | POA: Diagnosis present

## 2015-01-02 DIAGNOSIS — Z98891 History of uterine scar from previous surgery: Secondary | ICD-10-CM

## 2015-01-02 DIAGNOSIS — O328XX2 Maternal care for other malpresentation of fetus, fetus 2: Secondary | ICD-10-CM | POA: Diagnosis present

## 2015-01-02 DIAGNOSIS — Z6833 Body mass index (BMI) 33.0-33.9, adult: Secondary | ICD-10-CM | POA: Diagnosis not present

## 2015-01-02 DIAGNOSIS — O30043 Twin pregnancy, dichorionic/diamniotic, third trimester: Secondary | ICD-10-CM | POA: Diagnosis present

## 2015-01-02 DIAGNOSIS — O328XX1 Maternal care for other malpresentation of fetus, fetus 1: Secondary | ICD-10-CM | POA: Diagnosis present

## 2015-01-02 DIAGNOSIS — O9962 Diseases of the digestive system complicating childbirth: Secondary | ICD-10-CM | POA: Diagnosis present

## 2015-01-02 DIAGNOSIS — O3421 Maternal care for scar from previous cesarean delivery: Secondary | ICD-10-CM | POA: Diagnosis present

## 2015-01-02 LAB — RPR: RPR Ser Ql: NONREACTIVE

## 2015-01-02 LAB — PREPARE RBC (CROSSMATCH)

## 2015-01-02 SURGERY — Surgical Case
Anesthesia: Spinal

## 2015-01-02 MED ORDER — SIMETHICONE 80 MG PO CHEW: 80.0000 mg | CHEWABLE_TABLET | ORAL | Status: DC | PRN

## 2015-01-02 MED ORDER — ONDANSETRON HCL 4 MG/2ML IJ SOLN: 4.0000 mg | Freq: Three times a day (TID) | INTRAMUSCULAR | Status: DC | PRN

## 2015-01-02 MED ORDER — OXYCODONE-ACETAMINOPHEN 5-325 MG PO TABS: 2.0000 | ORAL_TABLET | ORAL | Status: DC | PRN

## 2015-01-02 MED ORDER — ONDANSETRON HCL 4 MG/2ML IJ SOLN
INTRAMUSCULAR | Status: AC
  Filled 2015-01-02: qty 2

## 2015-01-02 MED ORDER — NALBUPHINE HCL 10 MG/ML IJ SOLN: 5.0000 mg | Freq: Once | INTRAMUSCULAR | Status: AC | PRN

## 2015-01-02 MED ORDER — MEPERIDINE HCL 25 MG/ML IJ SOLN: 6.2500 mg | INTRAMUSCULAR | Status: DC | PRN

## 2015-01-02 MED ORDER — NALOXONE HCL 0.4 MG/ML IJ SOLN: 0.4000 mg | INTRAMUSCULAR | Status: DC | PRN

## 2015-01-02 MED ORDER — OXYCODONE-ACETAMINOPHEN 5-325 MG PO TABS: 1.0000 | ORAL_TABLET | ORAL | Status: DC | PRN

## 2015-01-02 MED ORDER — BUPIVACAINE LIPOSOME 1.3 % IJ SUSP
INTRAMUSCULAR | Status: DC | PRN
  Administered 2015-01-02: 20 mL

## 2015-01-02 MED ORDER — NALBUPHINE HCL 10 MG/ML IJ SOLN
5.0000 mg | Freq: Once | INTRAMUSCULAR | Status: AC | PRN
  Administered 2015-01-02: 5 mg via SUBCUTANEOUS

## 2015-01-02 MED ORDER — EPHEDRINE 5 MG/ML INJ
INTRAVENOUS | Status: AC
  Filled 2015-01-02: qty 10

## 2015-01-02 MED ORDER — KETOROLAC TROMETHAMINE 30 MG/ML IJ SOLN: 30.0000 mg | Freq: Four times a day (QID) | INTRAMUSCULAR | Status: AC | PRN

## 2015-01-02 MED ORDER — EPHEDRINE SULFATE 50 MG/ML IJ SOLN
INTRAMUSCULAR | Status: DC | PRN
  Administered 2015-01-02 (×2): 5 mg via INTRAVENOUS

## 2015-01-02 MED ORDER — IBUPROFEN 600 MG PO TABS
600.0000 mg | ORAL_TABLET | Freq: Four times a day (QID) | ORAL | Status: DC
  Administered 2015-01-02 – 2015-01-04 (×8): 600 mg via ORAL
  Filled 2015-01-02 (×8): qty 1

## 2015-01-02 MED ORDER — KETOROLAC TROMETHAMINE 30 MG/ML IJ SOLN
30.0000 mg | Freq: Four times a day (QID) | INTRAMUSCULAR | Status: AC | PRN
  Administered 2015-01-02: 30 mg via INTRAMUSCULAR

## 2015-01-02 MED ORDER — BUPIVACAINE HCL 0.25 % IJ SOLN
INTRAMUSCULAR | Status: DC | PRN
  Administered 2015-01-02: 30 mL

## 2015-01-02 MED ORDER — OXYTOCIN 40 UNITS IN LACTATED RINGERS INFUSION - SIMPLE MED: 62.5000 mL/h | INTRAVENOUS | Status: AC

## 2015-01-02 MED ORDER — ACETAMINOPHEN 325 MG PO TABS
650.0000 mg | ORAL_TABLET | ORAL | Status: DC | PRN
  Administered 2015-01-04 (×2): 650 mg via ORAL
  Filled 2015-01-02 (×2): qty 2

## 2015-01-02 MED ORDER — TETANUS-DIPHTH-ACELL PERTUSSIS 5-2.5-18.5 LF-MCG/0.5 IM SUSP: 0.5000 mL | Freq: Once | INTRAMUSCULAR | Status: DC

## 2015-01-02 MED ORDER — METHYLERGONOVINE MALEATE 0.2 MG PO TABS: 0.2000 mg | ORAL_TABLET | ORAL | Status: DC | PRN

## 2015-01-02 MED ORDER — NALBUPHINE HCL 10 MG/ML IJ SOLN: 5.0000 mg | INTRAMUSCULAR | Status: DC | PRN

## 2015-01-02 MED ORDER — SIMETHICONE 80 MG PO CHEW
80.0000 mg | CHEWABLE_TABLET | Freq: Three times a day (TID) | ORAL | Status: DC
  Administered 2015-01-02 – 2015-01-04 (×4): 80 mg via ORAL
  Filled 2015-01-02 (×4): qty 1

## 2015-01-02 MED ORDER — PHENYLEPHRINE 8 MG IN D5W 100 ML (0.08MG/ML) PREMIX OPTIME
INJECTION | INTRAVENOUS | Status: DC | PRN
  Administered 2015-01-02: 60 ug/min via INTRAVENOUS

## 2015-01-02 MED ORDER — IBUPROFEN 600 MG PO TABS: 600.0000 mg | ORAL_TABLET | Freq: Four times a day (QID) | ORAL | Status: DC | PRN

## 2015-01-02 MED ORDER — OXYTOCIN 10 UNIT/ML IJ SOLN
INTRAMUSCULAR | Status: AC
  Filled 2015-01-02: qty 4

## 2015-01-02 MED ORDER — DIPHENHYDRAMINE HCL 50 MG/ML IJ SOLN: 12.5000 mg | INTRAMUSCULAR | Status: DC | PRN

## 2015-01-02 MED ORDER — MORPHINE SULFATE (PF) 0.5 MG/ML IJ SOLN
INTRAMUSCULAR | Status: DC | PRN
  Administered 2015-01-02: .15 mg via INTRATHECAL

## 2015-01-02 MED ORDER — NALBUPHINE HCL 10 MG/ML IJ SOLN
INTRAMUSCULAR | Status: AC
  Administered 2015-01-02: 5 mg via SUBCUTANEOUS
  Filled 2015-01-02: qty 1

## 2015-01-02 MED ORDER — FENTANYL CITRATE (PF) 100 MCG/2ML IJ SOLN
INTRAMUSCULAR | Status: AC
  Filled 2015-01-02: qty 2

## 2015-01-02 MED ORDER — PRENATAL MULTIVITAMIN CH
1.0000 | ORAL_TABLET | Freq: Every day | ORAL | Status: DC
  Administered 2015-01-03 – 2015-01-04 (×2): 1 via ORAL
  Filled 2015-01-02 (×2): qty 1

## 2015-01-02 MED ORDER — CEFAZOLIN SODIUM-DEXTROSE 2-3 GM-% IV SOLR
2.0000 g | INTRAVENOUS | Status: AC
  Administered 2015-01-02: 2 g via INTRAVENOUS

## 2015-01-02 MED ORDER — SODIUM CHLORIDE 0.9 % IJ SOLN: 3.0000 mL | INTRAMUSCULAR | Status: DC | PRN

## 2015-01-02 MED ORDER — MENTHOL 3 MG MT LOZG: 1.0000 | LOZENGE | OROMUCOSAL | Status: DC | PRN

## 2015-01-02 MED ORDER — FENTANYL CITRATE (PF) 100 MCG/2ML IJ SOLN
INTRAMUSCULAR | Status: DC | PRN
  Administered 2015-01-02: 25 ug via INTRATHECAL

## 2015-01-02 MED ORDER — NALOXONE HCL 1 MG/ML IJ SOLN: 1.0000 ug/kg/h | INTRAVENOUS | Status: DC | PRN

## 2015-01-02 MED ORDER — SCOPOLAMINE 1 MG/3DAYS TD PT72
MEDICATED_PATCH | TRANSDERMAL | Status: AC
  Filled 2015-01-02: qty 1

## 2015-01-02 MED ORDER — DIPHENHYDRAMINE HCL 25 MG PO CAPS: 25.0000 mg | ORAL_CAPSULE | ORAL | Status: DC | PRN

## 2015-01-02 MED ORDER — KETOROLAC TROMETHAMINE 30 MG/ML IJ SOLN
INTRAMUSCULAR | Status: AC
  Filled 2015-01-02: qty 1

## 2015-01-02 MED ORDER — LACTATED RINGERS IV SOLN
INTRAVENOUS | Status: DC
  Administered 2015-01-02: 09:00:00 via INTRAVENOUS

## 2015-01-02 MED ORDER — SENNOSIDES-DOCUSATE SODIUM 8.6-50 MG PO TABS
2.0000 | ORAL_TABLET | ORAL | Status: DC
  Administered 2015-01-03 – 2015-01-04 (×2): 2 via ORAL
  Filled 2015-01-02 (×2): qty 2

## 2015-01-02 MED ORDER — CEFAZOLIN SODIUM-DEXTROSE 2-3 GM-% IV SOLR
INTRAVENOUS | Status: AC
  Filled 2015-01-02: qty 50

## 2015-01-02 MED ORDER — BUPIVACAINE HCL (PF) 0.25 % IJ SOLN
INTRAMUSCULAR | Status: AC
  Filled 2015-01-02: qty 30

## 2015-01-02 MED ORDER — OXYTOCIN 10 UNIT/ML IJ SOLN
40.0000 [IU] | INTRAVENOUS | Status: DC | PRN
  Administered 2015-01-02: 40 [IU] via INTRAVENOUS

## 2015-01-02 MED ORDER — ZOLPIDEM TARTRATE 5 MG PO TABS: 5.0000 mg | ORAL_TABLET | Freq: Every evening | ORAL | Status: DC | PRN

## 2015-01-02 MED ORDER — METHYLERGONOVINE MALEATE 0.2 MG/ML IJ SOLN: 0.2000 mg | INTRAMUSCULAR | Status: DC | PRN

## 2015-01-02 MED ORDER — LACTATED RINGERS IV SOLN
INTRAVENOUS | Status: DC
  Administered 2015-01-02 – 2015-01-03 (×2): via INTRAVENOUS

## 2015-01-02 MED ORDER — DIPHENHYDRAMINE HCL 25 MG PO CAPS: 25.0000 mg | ORAL_CAPSULE | Freq: Four times a day (QID) | ORAL | Status: DC | PRN

## 2015-01-02 MED ORDER — FENTANYL CITRATE (PF) 100 MCG/2ML IJ SOLN: 25.0000 ug | INTRAMUSCULAR | Status: DC | PRN

## 2015-01-02 MED ORDER — MORPHINE SULFATE 0.5 MG/ML IJ SOLN
INTRAMUSCULAR | Status: AC
  Filled 2015-01-02: qty 10

## 2015-01-02 MED ORDER — LACTATED RINGERS IV SOLN
INTRAVENOUS | Status: DC
  Administered 2015-01-02 (×3): via INTRAVENOUS

## 2015-01-02 MED ORDER — PHENYLEPHRINE 8 MG IN D5W 100 ML (0.08MG/ML) PREMIX OPTIME
INJECTION | INTRAVENOUS | Status: AC
  Filled 2015-01-02: qty 100

## 2015-01-02 MED ORDER — DIBUCAINE 1 % RE OINT: 1.0000 "application " | TOPICAL_OINTMENT | RECTAL | Status: DC | PRN

## 2015-01-02 MED ORDER — BUPIVACAINE IN DEXTROSE 0.75-8.25 % IT SOLN
INTRATHECAL | Status: DC | PRN
  Administered 2015-01-02: 2 mL via INTRATHECAL

## 2015-01-02 MED ORDER — NALBUPHINE HCL 10 MG/ML IJ SOLN
5.0000 mg | INTRAMUSCULAR | Status: DC | PRN
  Administered 2015-01-03: 5 mg via INTRAVENOUS
  Filled 2015-01-02: qty 1

## 2015-01-02 MED ORDER — WITCH HAZEL-GLYCERIN EX PADS: 1.0000 "application " | MEDICATED_PAD | CUTANEOUS | Status: DC | PRN

## 2015-01-02 MED ORDER — ONDANSETRON HCL 4 MG/2ML IJ SOLN
INTRAMUSCULAR | Status: DC | PRN
  Administered 2015-01-02: 4 mg via INTRAVENOUS

## 2015-01-02 MED ORDER — SCOPOLAMINE 1 MG/3DAYS TD PT72
1.0000 | MEDICATED_PATCH | Freq: Once | TRANSDERMAL | Status: DC
  Administered 2015-01-02: 1.5 mg via TRANSDERMAL

## 2015-01-02 MED ORDER — BUPIVACAINE LIPOSOME 1.3 % IJ SUSP
20.0000 mL | Freq: Once | INTRAMUSCULAR | Status: DC
  Filled 2015-01-02: qty 20

## 2015-01-02 MED ORDER — SIMETHICONE 80 MG PO CHEW
80.0000 mg | CHEWABLE_TABLET | ORAL | Status: DC
  Administered 2015-01-03 – 2015-01-04 (×2): 80 mg via ORAL
  Filled 2015-01-02 (×2): qty 1

## 2015-01-02 MED ORDER — LANOLIN HYDROUS EX OINT: 1.0000 "application " | TOPICAL_OINTMENT | CUTANEOUS | Status: DC | PRN

## 2015-01-02 SURGICAL SUPPLY — 31 items
CLAMP CORD UMBIL (MISCELLANEOUS) ×3 IMPLANT
CLIP FILSHIE TUBAL LIGA STRL (Clip) ×3 IMPLANT
CLOTH BEACON ORANGE TIMEOUT ST (SAFETY) ×3 IMPLANT
DRAPE SHEET LG 3/4 BI-LAMINATE (DRAPES) ×3 IMPLANT
DRSG OPSITE POSTOP 4X10 (GAUZE/BANDAGES/DRESSINGS) ×3 IMPLANT
DURAPREP 26ML APPLICATOR (WOUND CARE) ×3 IMPLANT
ELECT REM PT RETURN 9FT ADLT (ELECTROSURGICAL) ×3
ELECTRODE REM PT RTRN 9FT ADLT (ELECTROSURGICAL) ×1 IMPLANT
EXTRACTOR VACUUM M CUP 4 TUBE (SUCTIONS) IMPLANT
EXTRACTOR VACUUM M CUP 4' TUBE (SUCTIONS)
GLOVE BIO SURGEON STRL SZ7 (GLOVE) ×3 IMPLANT
GOWN STRL REUS W/TWL LRG LVL3 (GOWN DISPOSABLE) ×6 IMPLANT
KIT ABG SYR 3ML LUER SLIP (SYRINGE) IMPLANT
LIQUID BAND (GAUZE/BANDAGES/DRESSINGS) ×3 IMPLANT
NEEDLE HYPO 22GX1.5 SAFETY (NEEDLE) IMPLANT
NEEDLE HYPO 25X5/8 SAFETYGLIDE (NEEDLE) IMPLANT
NS IRRIG 1000ML POUR BTL (IV SOLUTION) ×3 IMPLANT
PACK C SECTION WH (CUSTOM PROCEDURE TRAY) ×3 IMPLANT
PAD OB MATERNITY 4.3X12.25 (PERSONAL CARE ITEMS) ×3 IMPLANT
RTRCTR C-SECT PINK 25CM LRG (MISCELLANEOUS) ×3 IMPLANT
STAPLER VISISTAT 35W (STAPLE) IMPLANT
SUT CHROMIC 2 0 CT 1 (SUTURE) ×6 IMPLANT
SUT PDS AB 0 CTX 60 (SUTURE) ×3 IMPLANT
SUT VIC AB 2-0 CT1 27 (SUTURE) ×2
SUT VIC AB 2-0 CT1 TAPERPNT 27 (SUTURE) ×1 IMPLANT
SUT VIC AB 3-0 CT1 27 (SUTURE) ×2
SUT VIC AB 3-0 CT1 TAPERPNT 27 (SUTURE) ×1 IMPLANT
SUT VIC AB 4-0 KS 27 (SUTURE) ×3 IMPLANT
SYR 30ML LL (SYRINGE) IMPLANT
TOWEL OR 17X24 6PK STRL BLUE (TOWEL DISPOSABLE) ×3 IMPLANT
TRAY FOLEY CATH SILVER 14FR (SET/KITS/TRAYS/PACK) ×3 IMPLANT

## 2015-01-02 NOTE — H&P (Signed)
Stacy Burton is a 34 y.o. female presenting for scheduled cesarean section  34 yo G2P1001 @ 38+0 presents for scheduled cesarean section for di/di twins. She had a prior cesarean section and desires a repeat. Additionally, she desires permanent sterilization.  History OB History    Gravida Para Term Preterm AB TAB SAB Ectopic Multiple Living   2 1 1  0 0 0 0 0 0 1     Past Medical History  Diagnosis Date  . No pertinent past medical history   . Allergy   . Medical history non-contributory    Past Surgical History  Procedure Laterality Date  . Appendectomy    . Cesarean section  11/28/2011    Procedure: CESAREAN SECTION;  Surgeon: Annamaria Hellingobert Wein, MD;  Location: [facility];  Service: Gynecology;  Laterality: N/A;  . Wisdom tooth extraction     Family History: family history includes Cancer in her mother; Depression in her maternal grandfather and sister; Hyperlipidemia in her father; Hypothyroidism in her mother; Mental retardation in her sister. There is no history of Anesthesia problems. Social History:  reports that she has never smoked. She has never used smokeless tobacco. She reports that she does not drink alcohol or use illicit drugs.   Prenatal Transfer Tool  Maternal Diabetes: No Genetic Screening: Normal Maternal Ultrasounds/Referrals: Normal Fetal Ultrasounds or other Referrals:  None Maternal Substance Abuse:  No Significant Maternal Medications:  None Significant Maternal Lab Results:  None Other Comments:  None  ROS    There were no vitals taken for this visit. Exam Physical Exam  Prenatal labs: ABO, Rh: --/--/AB POS, AB POS (06/02 1605) Antibody: NEG (06/02 1605) Rubella: Immune (12/08 0000) RPR: Non Reactive (06/02 1605)  HBsAg: Negative (12/08 0000)  HIV: Non-reactive (12/08 0000)  GBS:     Assessment/Plan: 1) ADMIT 2) Consent  3) Repeat c/s with tubal   Laysa Kimmey H. 01/02/2015, 7:50 AM

## 2015-01-02 NOTE — Lactation Note (Signed)
This note was copied from the chart of Stacy Shayli Hoiland. Lactation Consultation Note Initial visit with Twins at 12 hours of age.  Baby Girl A finished a feeding about an hour ago and is asleep in FOB's arms.  Baby A is noted to have some grunting sounds, but appears without distress and parents mention Rn is aware.  Baby boy B is latched and breastfeeding well per mom who denies pain.  Lights in room are dim so observation of latch was not clear.  Discussed positions for baby learning to latch and encouraged football hold or cross cradle.  Babies are full term and average weight doing well with feedings. [facility] LC resources given and discussed.  Encouraged to feed with early cues on demand.  Early newborn behavior discussed.  Hand expression reported by mom with colostrum visible.  Mom to call for assist as needed.      Patient Name: Stacy Burton Today's Date: 01/02/2015 Reason for consult: Initial assessment;Multiple gestation   Maternal Data Has patient been taught Hand Expression?: Yes Does the patient have breastfeeding experience prior to this delivery?: Yes  Feeding Feeding Type: Breast Fed Length of feed: 20 min  LATCH Score/Interventions                Intervention(s): Breastfeeding basics reviewed;Position options     Lactation Tools Discussed/Used WIC Program: No   Consult Status Consult Status: Follow-up Date: 01/03/15 Follow-up type: In-patient    Christyn Gutkowski Lynn 01/02/2015, 9:48 PM    

## 2015-01-02 NOTE — Anesthesia Preprocedure Evaluation (Addendum)
Anesthesia Evaluation  Patient identified by MRN, date of birth, ID band Patient awake    Reviewed: Allergy & Precautions, NPO status , Patient's Chart, lab work & pertinent test results  Airway Mallampati: III  TM Distance: >3 FB Neck ROM: Full    Dental no notable dental hx. (+) Teeth Intact   Pulmonary neg pulmonary ROS,  breath sounds clear to auscultation  Pulmonary exam normal       Cardiovascular Normal cardiovascular examRhythm:Regular Rate:Normal     Neuro/Psych negative neurological ROS  negative psych ROS   GI/Hepatic Neg liver ROS, GERD-  Medicated and Controlled,  Endo/Other  Obesity  Renal/GU negative Renal ROS  negative genitourinary   Musculoskeletal   Abdominal (+) + obese,   Peds  Hematology negative hematology ROS (+)   Anesthesia Other Findings   Reproductive/Obstetrics (+) Pregnancy Twin Gestation                             Anesthesia Physical Anesthesia Plan  ASA: II  Anesthesia Plan: Spinal   Post-op Pain Management:    Induction:   Airway Management Planned: Natural Airway  Additional Equipment:   Intra-op Plan:   Post-operative Plan:   Informed Consent: I have reviewed the patients History and Physical, chart, labs and discussed the procedure including the risks, benefits and alternatives for the proposed anesthesia with the patient or authorized representative who has indicated his/her understanding and acceptance.     Plan Discussed with: CRNA, Anesthesiologist and Surgeon  Anesthesia Plan Comments:         Anesthesia Quick Evaluation

## 2015-01-02 NOTE — Transfer of Care (Signed)
Immediate Anesthesia Transfer of Care Note  Patient: Stacy Burton  Procedure(s) Performed: Procedure(s) with comments: CESAREAN SECTION (N/A) - EDD: 01/16/2015 NKDA   Patient Location: Mother/Baby  Anesthesia Type:Spinal  Level of Consciousness: awake and alert   Airway & Oxygen Therapy: Patient Spontanous Breathing  Post-op Assessment: Report given to RN and Post -op Vital signs reviewed and stable  Post vital signs: Reviewed and stable  Last Vitals:  Filed Vitals:   01/02/15 0759  BP: 132/82  Pulse: 84  Temp: 37 C  Resp: 18    Complications: No apparent anesthesia complications

## 2015-01-02 NOTE — Anesthesia Procedure Notes (Signed)
Spinal Patient location during procedure: OR Start time: 01/02/2015 8:58 AM Staffing Anesthesiologist: Mal AmabileFOSTER, Jayanth Szczesniak Performed by: anesthesiologist  Preanesthetic Checklist Completed: patient identified, site marked, surgical consent, pre-op evaluation, timeout performed, IV checked, risks and benefits discussed and monitors and equipment checked Spinal Block Patient position: sitting Prep: site prepped and draped and DuraPrep Patient monitoring: heart rate, cardiac monitor, continuous pulse ox and blood pressure Approach: midline Location: L3-4 Injection technique: single-shot Needle Needle type: Sprotte  Needle gauge: 24 G Needle length: 9 cm Needle insertion depth: 6 cm Assessment Sensory level: T4 Additional Notes Patient tolerated procedure well. Adequate sensory level.

## 2015-01-02 NOTE — Op Note (Signed)
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 Procedure: repeat low transverse cesarean section, bilateral tubal occlusion with Filshie clips, lysis of adhesions Surgeon: Dr. Waynard Reeds Assistant: Dr. Essie Hart Operative Findings: vigorous female and female infant in the double footling breech presentation 2. Baby A female, double footling breech, 7 lbs. 12 oz. With Apgars of 9 at 1 minute and 9 at 5 minutes. Baby B female, double footling breech, 6 pounds 9 ounces with Apgars of 8 at 1 minute and 9 at 5 minutes. 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 Normal ovaries and tubes bilaterally. Specimen: placenta EBL: Total I/O In: 2600 [I.V.:2600] Out: 1882 [Urine:800; Blood:1082]   Procedure:Ms. Folks is an 34 year old gravida 2 para 1001 at 47 weeks and 0 days estimated gestational age who presents for cesarean section. Following the appropriate informed consent the patient was brought to the operating room where spinal anesthesia was administered and found to be adequate. She was placed in the dorsal supine position with a leftward tilt. She was prepped and draped in the normal sterile fashion. Scalpel was then used to make a Pfannenstiel skin incision which was carried down to the underlying layers of soft tissue to the fascia. The fascia was incised in the midline and the fascial incision was extended laterally with Mayo scissors. The superior aspect of the fascial incision was grasped with Coker clamps x2, tented up and the rectus muscles dissected off sharply with the electrocautery unit area and the same procedure was repeated on the inferior aspect of the fascial incision. The rectus muscles were separated in the midline. the  abdominal peritoneum was adhesed to the anterior uterine serosa. This was taken down with a combination of sharp and blunt dissection.  The abdominal peritoneum was identified, tented up, entered sharply, and the incision was extended superiorly and inferiorly with good visualization of the bladder. The Alexis retractor was then deployed. Scalpel was then used to make a low transverse incision on the uterus which was extended laterally with both blunt dissection and the bandage scissors. The fetal feet of baby A was identified, delivered easily through the uterine incision followed by the body & head. The infant was bulb suctioned on the operative field cried vigorously, cord was clamped and cut and the infant was passed to the waiting neonatologist. The amniotic sac of baby B was identified and amniotomy was performed for clear fluid. The feet of baby B were grasped and delivered through the uterine incision followed by the body. The infant cried vigorously on the surgical field, cord was clamped and cut, and the infant was passed to the neonatologist. Cord blood was then collected on baby A and baby B and then the placentas were delivered spontaneously, the uterus was cleared of all clot and debris. The uterine incision was repaired with #1 chromic in running locked fashion. Ovaries and tubes were inspected and normal. The left fallopian tube was grasped with a Babcock and followed out to the fimbriated end. The Filshie clip applier was placed across the fallopian tube circumferentially. The same procedure was repeated on the right hand tube. The Alexis retractor was removed. The uterus was returned to the abdominal cavity the abdominal cavity was cleared of all clot and debris. A defect in the uterine serosa was repaired with 30 Vicryls and  a running fashion. The abdominal peritoneum was reapproximated with 2-0 Vicryl in a running fashion, the rectus muscles was reapproximated with #1 chromic in a running  fashion. The fascia was closed with a looped PDS in a running fashion. The skin was closed with 4-0 vicryl in a subcuticular fashion and skin glue. All sponge lap and needle counts were correct x2. Patient tolerated the procedure well and recovered in stable condition following the procedure.

## 2015-01-02 NOTE — Anesthesia Postprocedure Evaluation (Signed)
  Anesthesia Post-op Note  Patient: Stacy Burton  Procedure(s) Performed: Procedure(s) with comments: CESAREAN SECTION (N/A) - EDD: 01/16/2015 NKDA   Patient Location: PACU  Anesthesia Type:Spinal  Level of Consciousness: awake, alert  and oriented  Airway and Oxygen Therapy: Patient Spontanous Breathing  Post-op Pain: none  Post-op Assessment: Post-op Vital signs reviewed, Patient's Cardiovascular Status Stable, Respiratory Function Stable, Patent Airway, No signs of Nausea or vomiting, Pain level controlled, No headache and No backache  Post-op Vital Signs: Reviewed and stable  Last Vitals:  Filed Vitals:   01/02/15 1115  BP:   Pulse: 65  Temp:   Resp: 20    Complications: No apparent anesthesia complications

## 2015-01-03 LAB — CBC
HCT: 29.5 % — ABNORMAL LOW (ref 36.0–46.0)
HEMOGLOBIN: 9.8 g/dL — AB (ref 12.0–15.0)
MCH: 29.8 pg (ref 26.0–34.0)
MCHC: 32.9 g/dL (ref 30.0–36.0)
MCV: 90.8 fL (ref 78.0–100.0)
Platelets: 161 10*3/uL (ref 150–400)
RBC: 3.25 MIL/uL — ABNORMAL LOW (ref 3.87–5.11)
RDW: 14 % (ref 11.5–15.5)
WBC: 13.3 10*3/uL — ABNORMAL HIGH (ref 4.0–10.5)

## 2015-01-03 LAB — BIRTH TISSUE RECOVERY COLLECTION (PLACENTA DONATION)

## 2015-01-03 NOTE — Anesthesia Postprocedure Evaluation (Signed)
  Anesthesia Post-op Note  Patient: Stacy Burton  Procedure(s) Performed: Procedure(s) with comments: CESAREAN SECTION (N/A) - EDD: 01/16/2015 NKDA   Patient Location: Mother/Baby  Anesthesia Type:Spinal  Level of Consciousness: awake  Airway and Oxygen Therapy: Patient Spontanous Breathing  Post-op Pain: mild  Post-op Assessment: Post-op Vital signs reviewed, Patient's Cardiovascular Status Stable, Respiratory Function Stable, No signs of Nausea or vomiting, Pain level controlled, No headache, No residual numbness and No residual motor weakness  Post-op Vital Signs: Reviewed  Last Vitals:  Filed Vitals:   01/03/15 0435  BP: 113/64  Pulse: 67  Temp: 37.2 C  Resp: 18    Complications: No apparent anesthesia complications

## 2015-01-04 LAB — TYPE AND SCREEN
ABO/RH(D): AB POS
ANTIBODY SCREEN: NEGATIVE
Unit division: 0
Unit division: 0

## 2015-01-04 MED ORDER — IBUPROFEN 600 MG PO TABS: 600.0000 mg | ORAL_TABLET | Freq: Four times a day (QID) | ORAL | Status: DC | PRN

## 2015-01-04 MED ORDER — SENNOSIDES-DOCUSATE SODIUM 8.6-50 MG PO TABS: 2.0000 | ORAL_TABLET | Freq: Every evening | ORAL | Status: DC | PRN

## 2015-01-04 MED ORDER — FERROUS SULFATE 325 (65 FE) MG PO TABS: 325.0000 mg | ORAL_TABLET | Freq: Every day | ORAL | Status: DC

## 2015-01-04 MED ORDER — OXYCODONE-ACETAMINOPHEN 5-325 MG PO TABS: 1.0000 | ORAL_TABLET | ORAL | Status: DC | PRN

## 2015-01-04 NOTE — Discharge Summary (Signed)
Obstetric Discharge Summary Reason for Admission: cesarean section Prenatal Procedures: NST and ultrasound Intrapartum Procedures: cesarean: low cervical, transverse Postpartum Procedures: none Complications-Operative and Postpartum: none HEMOGLOBIN  Date Value Ref Range Status  01/03/2015 9.8* 12.0 - 15.0 g/dL Final    Comment:    REPEATED TO VERIFY DELTA CHECK NOTED    HCT  Date Value Ref Range Status  01/03/2015 29.5* 36.0 - 46.0 % Final    Physical Exam:  General: alert, cooperative and no distress Lochia: appropriate Uterine Fundus: firm Incision: healing well, no significant drainage, no dehiscence, no significant erythema DVT Evaluation: No evidence of DVT seen on physical exam. Negative Homan's sign. No cords or calf tenderness.  Discharge Diagnoses: Term Pregnancy-delivered  Discharge Information: Date: 01/04/2015 Activity: pelvic rest Diet: routine Medications: Ibuprofen, Colace, Iron and Percocet Condition: stable Instructions: refer to practice specific booklet Discharge to: home   Newborn Data:   Jarold SongCobb, GirlA Elide [[id number]][[id number]]  Live born female  Birth Weight: 7 lb 12.2 oz (3520 g) APGAR: 8, 9   Inez CatalinaCobb, BoyB Graycen [[id number]][[id number]]  Live born female  Birth Weight: 6 lb 9.1 oz (2980 g) APGAR: 8, 9  Home with mother.  Essie HartINN, Krishan Mcbreen STACIA 01/04/2015, 8:42 AM

## 2015-01-04 NOTE — Progress Notes (Signed)
Subjective: Postpartum Day 2: Cesarean Delivery Patient reports tolerating PO, + flatus, + BM and no problems voiding.   Patient reports that her pain is well controlled, she is ambulating w/o difficulty She denies CP/ SOB/F/C/N/V  Objective: Vital signs in last 24 hours: Temp:  [97.9 F (36.6 C)-98.4 F (36.9 C)] 97.9 F (36.6 C) (06/05 0610) Pulse Rate:  [68-86] 68 (06/05 0610) Resp:  [18-20] 18 (06/05 0610) BP: (111-121)/(66-84) 111/66 mmHg (06/05 0610) SpO2:  [97 %] 97 % (06/04 0900)  Physical Exam:  General: alert, cooperative and no distress Lochia: appropriate Uterine Fundus: firm Incision: healing well, no significant drainage, no dehiscence, no significant erythema DVT Evaluation: No evidence of DVT seen on physical exam. Negative Homan's sign. No cords or calf tenderness.   Recent Labs  01/01/15 1605 01/03/15 0614  HGB 12.3 9.8*  HCT 35.1* 29.5*    Assessment/Plan: Status post Cesarean section. Doing well postoperatively.  Patient desires to go home today. Will circumcise baby boy prior to discharge  Wynonia HazardINN, Reon Hunley STACIA 01/04/2015, 8:37 AM

## 2015-01-04 NOTE — Lactation Note (Signed)
This note was copied from the chart of Stacy Endya Schwake. Lactation Consultation Note  Baby has been sleepy and not BF as aggressively as his sister.  Mom was easily able to hand express colostrum so we spoon fed him a small amount.  He was more awake after this,  We latched him and he suckled well though stimulation was needed at times to entice him to continue feeding.  Many swallows were heard and he detached satisfied.  I encouraged mom to alternate babies between breasts to aid in milk production.  Hand expression after feedings also encouraged to aid in optimal milk production.  Outpatient appointment scheduled.  Patient Name: Stacy CatalinaBoyB Rafaela Burton Today's Date: 01/04/2015     Maternal Data    Feeding Feeding Type: Breast Fed Length of feed: 10 min  LATCH Score/Interventions Latch: Repeated attempts needed to sustain latch, nipple held in mouth throughout feeding, stimulation needed to elicit sucking reflex.  Audible Swallowing: A few with stimulation  Type of Nipple: Everted at rest and after stimulation  Comfort (Breast/Nipple): Soft / non-tender     Hold (Positioning): Assistance needed to correctly position infant at breast and maintain latch.  LATCH Score: 7  Lactation Tools Discussed/Used     Consult Status Consult Status: Follow-up Date: 01/13/15 Follow-up type: Out-patient    Soyla DryerJoseph, Cherilyn Sautter 01/04/2015, 10:54 AM

## 2015-01-05 ENCOUNTER — Encounter (HOSPITAL_COMMUNITY): Payer: Self-pay | Admitting: Obstetrics and Gynecology

## 2015-01-06 ENCOUNTER — Other Ambulatory Visit (HOSPITAL_COMMUNITY): Payer: BC Managed Care – PPO

## 2015-01-10 ENCOUNTER — Encounter (HOSPITAL_COMMUNITY): Payer: Self-pay | Admitting: *Deleted

## 2015-01-10 ENCOUNTER — Inpatient Hospital Stay (HOSPITAL_COMMUNITY)
Admission: AD | Admit: 2015-01-10 | Discharge: 2015-01-10 | Disposition: A | Discharge status: Home / Self Care | Payer: BC Managed Care – PPO | Source: Ambulatory Visit | Attending: Obstetrics | Admitting: Obstetrics

## 2015-01-10 DIAGNOSIS — O860 Infection of obstetric surgical wound: Secondary | ICD-10-CM | POA: Diagnosis not present

## 2015-01-10 DIAGNOSIS — Y838 Other surgical procedures as the cause of abnormal reaction of the patient, or of later complication, without mention of misadventure at the time of the procedure: Secondary | ICD-10-CM | POA: Diagnosis not present

## 2015-01-10 DIAGNOSIS — Z3A00 Weeks of gestation of pregnancy not specified: Secondary | ICD-10-CM | POA: Diagnosis not present

## 2015-01-10 DIAGNOSIS — L0390 Cellulitis, unspecified: Secondary | ICD-10-CM | POA: Insufficient documentation

## 2015-01-10 DIAGNOSIS — Z4889 Encounter for other specified surgical aftercare: Secondary | ICD-10-CM | POA: Diagnosis present

## 2015-01-10 DIAGNOSIS — IMO0001 Reserved for inherently not codable concepts without codable children: Secondary | ICD-10-CM

## 2015-01-10 DIAGNOSIS — T814XXA Infection following a procedure, initial encounter: Secondary | ICD-10-CM

## 2015-01-10 MED ORDER — CEPHALEXIN 500 MG PO CAPS: 500.0000 mg | ORAL_CAPSULE | Freq: Four times a day (QID) | ORAL | Status: DC

## 2015-01-10 NOTE — MAU Provider Note (Signed)
History     CSN: [id number]  Arrival date and time: 01/10/15 1618   First Provider Initiated Contact with Patient 01/10/15 1637      Chief Complaint  Patient presents with  . Wound Check   HPI  [name] is a [id number] here with report of increased redness and itching around csection incision.  Small area of dull pain superior to right side of incision.  Csection completed on 01/02/15.  Pt unable to determine if she's experienced fever, body aches or chills since she's taking pain medication as scheduled.    Past Medical History  Diagnosis Date  . No pertinent past medical history   . Allergy   . Medical history non-contributory     Past Surgical History  Procedure Laterality Date  . Appendectomy    . Cesarean section  11/28/2011    Procedure: CESAREAN SECTION;  Surgeon: Annamaria Helling, MD;  Location: [facility];  Service: Gynecology;  Laterality: N/A;  . Wisdom tooth extraction    . Cesarean section N/A 01/02/2015    Procedure: CESAREAN SECTION;  Surgeon: Waynard Reeds, MD;  Location: [facility];  Service: Obstetrics;  Laterality: N/A;  EDD: 01/16/2015 NKDA     Family History  Problem Relation Age of Onset  . Anesthesia problems Neg Hx   . Depression Sister   . Mental retardation Sister   . Depression Maternal Grandfather   . Hypothyroidism Mother   . Cancer Mother     uterine cancer  . Hyperlipidemia Father     History  Substance Use Topics  . Smoking status: Never Smoker   . Smokeless tobacco: Never Used  . Alcohol Use: No    Allergies: No Known Allergies  Prescriptions prior to admission  Medication Sig Dispense Refill Last Dose  . cetirizine (ZYRTEC) 10 MG tablet Take 10 mg by mouth daily.   01/01/2015 at Unknown time  . ferrous sulfate 325 (65 FE) MG tablet Take 1 tablet (325 mg total) by mouth daily with breakfast. 60 tablet 3   . FOLIC ACID PO Take 1 tablet by mouth daily.   Past Week at Unknown time  . ibuprofen (ADVIL,MOTRIN) 600 MG tablet Take 1 tablet (600  mg total) by mouth every 6 (six) hours as needed. 60 tablet 3   . oxyCODONE-acetaminophen (PERCOCET/ROXICET) 5-325 MG per tablet Take 1-2 tablets by mouth every 4 (four) hours as needed (for pain scale 4-7). 60 tablet 0   . PRENATAL VITAMINS PO Take by mouth.   01/01/2015 at Unknown time  . senna-docusate (SENOKOT-S) 8.6-50 MG per tablet Take 2 tablets by mouth at bedtime as needed for mild constipation. 60 tablet 3     Review of Systems  Constitutional: Negative for fever and chills.  Gastrointestinal: Positive for abdominal pain (small area of pain).  Skin: Positive for itching and rash.  All other systems reviewed and are negative.  Physical Exam   Blood pressure 118/84, pulse 75, temperature 97.1 F (36.2 C), temperature source Oral, resp. rate 18, unknown if currently breastfeeding.  Physical Exam  Constitutional: She is oriented to person, place, and time. She appears well-developed and well-nourished. No distress.  HENT:  Head: Normocephalic.  Neck: Normal range of motion. Neck supple.  GI: Soft. There is no tenderness. There is no guarding.  Incision site red with areas of dried yellow discharge; well approximated, no palpable mass.  Skin red extending up abdomen and into mons area  Genitourinary: No bleeding in the vagina.  Neurological: She  is alert and oriented to person, place, and time. She has normal reflexes.  Skin: Skin is warm and dry.    MAU Course  Procedures 1640 Dr. Chestine Spore called and notified regarding appearance of incision > will come in to evaluate Marlis Edelson, CNM  Assessment and Plan

## 2015-01-10 NOTE — Progress Notes (Signed)
34 y.o. [id number] POD#8 s/p RCS 2/2 twin pregnancy presents w complaints of redness and drainage from her incision.  She reports thin yellow drainage from the right aspect of the incision x 2 days, small amount.  Reports that the [facility] nurse (home visit to weigh her twins) examined her incision today and felt that she was having an allergic reaction.  Pain is well controlled with motrin and percocet.  Denies fevers/chills/nausea/vomiting/diarrhea/constipation.  BF going well. Incision is mildly tender, but has continued to improve since discharge. Reports that she did need to have her incision opened and packed after her first c/s.   Past Medical History  Diagnosis Date  . No pertinent past medical history   . Allergy   . Medical history non-contributory     Past Surgical History  Procedure Laterality Date  . Appendectomy    . Cesarean section  11/28/2011    Procedure: CESAREAN SECTION;  Surgeon: Annamaria Helling, MD;  Location: [facility];  Service: Gynecology;  Laterality: N/A;  . Wisdom tooth extraction    . Cesarean section N/A 01/02/2015    Procedure: CESAREAN SECTION;  Surgeon: Waynard Reeds, MD;  Location: [facility];  Service: Obstetrics;  Laterality: N/A;  EDD: 01/16/2015 NKDA     OB History  Gravida Para Term Preterm AB SAB TAB Ectopic Multiple Living  0 0 0 0 0 1 3    # Outcome Date GA Lbr Len/2nd Weight Sex Delivery Anes PTL Lv  2A Term 01/02/15 [redacted]w[redacted]d  3.52 kg (7 lb 12.2 oz) F CS-LTranv Spinal  Y  2B Term 01/02/15 [redacted]w[redacted]d  2.98 kg (6 lb 9.1 oz) M CS-LTranv   Y  1 Term 11/28/11 [redacted]w[redacted]d 16:50 / 03:05 3.84 kg (8 lb 7.5 oz) F CS-LTranv EPI  Y     Comments: NA      History   Social History  . Marital Status: Married    Spouse Name: N/A  . Number of Children: N/A  . Years of Education: N/A   Occupational History  . Not on file.   Social History Main Topics  . Smoking status: Never Smoker   . Smokeless tobacco: Never Used  . Alcohol Use: No  . Drug Use: No  . Sexual Activity:  Yes    Birth Control/ Protection: None   Other Topics Concern  . Not on file   Social History Narrative   Review of patient's allergies indicates no known allergies.        Filed Vitals:   01/10/15 1728  BP: 123/84  Pulse: 71  Temp: 97.50F  Resp: 18     General:  NAD Abdomen:  Low transverse skin incision w dermabond.  Small amount of crusted serous drainage at right and left aspects.  Petroleum jelly applied to loosen dermabond to better examine incision.  Scant serous drainage expressed from right aspect of incision.  Wound probed w cotton swab and no defect identified.  Erythema inferior to mons w some associated edema.  No area of fluctuance identified. No induration superior or inferior.  Erythema 1-2 cm superior to incision, slightly raised at left aspect c/w contact dermatitis.  At left aspect of incision, clear allergic reaction to adhesive dressing.  Erythema marked.  Mild pain w exam, no numbness.   A/P   34 y.o.  [id number] POD#8 s/p RCS w wound cellulitis.   Minimal serous drainage. I do not appreciate any fluctuance c/w seroma.  No induration.  C/w superficial skin infection at  this time. Will start on keflex 500mg  qid.  Plan to f/u in office in 2 days to re-evaluate response to PO antibiotics.  Precautions given for patient to return if T >101F, worsening redness, nausea, vomiting, worsening pain, if the areas around the incision turn purple/black, or if drainage increases significantly.    [facility] GEFFEL [employer]

## 2015-01-10 NOTE — Discharge Instructions (Signed)

## 2015-01-10 NOTE — MAU Note (Signed)
Incision is a little red since delivery, mainly itching just pain in one area

## 2015-01-13 ENCOUNTER — Encounter (HOSPITAL_COMMUNITY): Payer: BC Managed Care – PPO

## 2015-01-13 ENCOUNTER — Ambulatory Visit (HOSPITAL_COMMUNITY): Payer: BC Managed Care – PPO

## 2015-01-20 ENCOUNTER — Ambulatory Visit (HOSPITAL_COMMUNITY)
Admission: RE | Admit: 2015-01-20 | Discharge: 2015-01-20 | Disposition: A | Discharge status: Home / Self Care | Payer: BC Managed Care – PPO | Source: Ambulatory Visit | Attending: Family Medicine | Admitting: Family Medicine

## 2015-01-20 NOTE — Lactation Note (Signed)
Lactation Consult  Mother's reason for visit:  Feeding assessment; sore nipples Visit Type: Feeding assessment twins Appointment Notes:  none Consult:  Initial Lactation Consultant:  Stacy Burton  ________________________________________________________________________  Baby's Name: Stacy Burton Date of Birth: 01/02/2016 Pediatrician: Stacy Burton Gender: female Gestational Age: [redacted]w[redacted]d (At Birth) Birth Weight: 7 lb 12.2 oz (3520 g) Weight at Discharge: Weight: 7 lb 1.4 oz (3215 g)Date of Discharge: 01/04/2015 [facility] Weights   01/02/15 1610 01/03/15 0000 01/03/15 2357  Weight: 7 lb 12.2 oz (3520 g) 7 lb 8.3 oz (3410 g) 7 lb 1.4 oz (3215 g)   Last weight taken from location outside of [facility]: 7-11 on 01/15/15 Location:[facility] Weight today: 8-4.3    Baby's Name: Stacy Burton Date of Birth: 01/02/2016 Pediatrician: Stacy Burton Gender: female Gestational Age: [redacted]w[redacted]d (At Birth) Birth Weight: 6 lb 9.1 oz (2980 g) Weight at Discharge: Weight: 6 lb 1.2 oz (2755 g)Date of Discharge: 01/04/2015 [facility] Weights   01/02/15 0928 01/03/15 0000 01/03/15 2357  Weight: 6 lb 9.1 oz (2980 g) 6 lb 5.9 oz (2890 g) 6 lb 1.2 oz (2755 g)   Last weight taken from location outside of [facility]: 6-9 on 01/15/15 Location:[facility] Weight today: 7-0.8      ________________________________________________________________________  Mother's Name: Stacy Burton Type of delivery:  C/S Breastfeeding Experience:  Breastfed first baby without difficulty    ________________________________________________________________________  Breastfeeding History (Post Discharge)  Frequency of breastfeeding:  Every 2-3 hours Duration of feeding: 10 minutes    Infant Intake and Output Assessment  Voids: 6-8 in 24 hrs.  Color:  Clear yellow Stools:  3-4 in 24 hrs.  Color:   Yellow  ________________________________________________________________________  Maternal Breast Assessment  Breast:  Full Nipple:  Erect, left nipple bifurcated    _______________________________________________________________________ Feeding Assessment/Evaluation  Mom and 6 week old twins here for feeding assessment.  Mom feels like breastfeeding is going well but c/o some nipple tenderness.  She is feeding babies on demand and at the same time for most feedings.  Babies are both beyond birth weight and gaining a little over 1 ounce/day.  Nipples slightly reddened but intact.  Assisted mom with supporting breast using a U hold and worked on better latch techniques and untucking bottom lip..  Both babies latched easily and Mom reports feeding more comfortable.  Babies transferred 24 mls in 10 minutes and came off breast content.  Mom fed them just prior to consultation because they were hungry so this feeding was more topping them off.  Encouraged mom to call office with any concerns or request for follow up.  She states [facility] will be following weights and pedi appointment scheduled for 1 month.    Initial feeding assessment:  Infant's oral assessment:  WNL  Positioning:  Football Right breast/Stacy Burton  Left breast/Stacy Burton  LATCH documentation:  Latch:  2 = Grasps breast easily, tongue down, lips flanged, rhythmical sucking.  Audible swallowing:  2 = Spontaneous and intermittent  Type of nipple:  2 = Everted at rest and after stimulation  Comfort (Breast/Nipple):  1 = Filling, red/small blisters or bruises, mild/mod discomfort  Hold (Positioning):  1 = Assistance needed to correctly position infant at breast and maintain latch  LATCH score:  8  Attached assessment:  Shallow/ Stacy Burton initially but corrected   Deep/Stacy Burton  Lips flanged:  No.  Lips untucked:  Yes.    Suck assessment:  Nutritive    Stacy Burton Pre-feed weight:  3752 g  Post-feed weight:  3776 g Amount  transferred:  24 ml Amount supplemented:  0 ml  Stacy Burton Pre-feed weight:  3196 g Post-feed weight:  3220 g Amount transferred:  24 ml Amount supplemented: 0 ml

## 2015-08-13 ENCOUNTER — Ambulatory Visit (INDEPENDENT_AMBULATORY_CARE_PROVIDER_SITE_OTHER)
Admission: RE | Admit: 2015-08-13 | Discharge: 2015-08-13 | Disposition: A | Discharge status: Home / Self Care | Payer: BC Managed Care – PPO | Source: Ambulatory Visit | Attending: Internal Medicine | Admitting: Internal Medicine

## 2015-08-13 ENCOUNTER — Encounter: Payer: Self-pay | Admitting: Internal Medicine

## 2015-08-13 ENCOUNTER — Ambulatory Visit (INDEPENDENT_AMBULATORY_CARE_PROVIDER_SITE_OTHER): Payer: BC Managed Care – PPO | Admitting: Internal Medicine

## 2015-08-13 VITALS — BP 108/60 | HR 72 | Temp 98.4°F | Wt 191.0 lb

## 2015-08-13 DIAGNOSIS — R0781 Pleurodynia: Secondary | ICD-10-CM

## 2015-08-13 MED ORDER — ONDANSETRON HCL 4 MG PO TABS: 4.0000 mg | ORAL_TABLET | Freq: Three times a day (TID) | ORAL | Status: DC | PRN

## 2015-08-13 NOTE — Assessment & Plan Note (Addendum)
In the setting of an apparent acute viral syndrome (that seems mostly GI) CXR is negative She is really weak Discussed oral rehydration Will send rx for ondansetron just in case (should be okay with nursing since can be given to children over 6 months) I recommended trying to decrease nursing till oral intake improves Don't worry about food for now If not able to tolerate PO---may need ER for IV fluids

## 2015-08-13 NOTE — Progress Notes (Signed)
   Subjective:    Patient ID: Stacy Burton, female    DOB: 10-Feb-1981, 35 y.o.   MRN: [id number]  HPI Here due to respiratory illness  Awoke last night chills and sweats and crampy stomach Bad headache and back pain Some nausea without vomiting Low grade fever-- 100. Ibuprofen may have helped the chills  No cough or rhinorrhea Some pain with deep breath No SOB--just the pain with deep breath  Current Outpatient Prescriptions on File Prior to Visit  Medication Sig Dispense Refill  . PRENATAL VITAMINS PO Take by mouth.     No current facility-administered medications on file prior to visit.    No Known Allergies  Past Medical History  Diagnosis Date  . No pertinent past medical history   . Allergy   . Medical history non-contributory     Past Surgical History  Procedure Laterality Date  . Appendectomy    . Cesarean section  11/28/2011    Procedure: CESAREAN SECTION;  Surgeon: Annamaria Hellingobert Wein, MD;  Location: [facility];  Service: Gynecology;  Laterality: N/A;  . Wisdom tooth extraction    . Cesarean section N/A 01/02/2015    Procedure: CESAREAN SECTION;  Surgeon: Waynard ReedsKendra Ross, MD;  Location: [facility];  Service: Obstetrics;  Laterality: N/A;  EDD: 01/16/2015 NKDA     Family History  Problem Relation Age of Onset  . Anesthesia problems Neg Hx   . Depression Sister   . Mental retardation Sister   . Depression Maternal Grandfather   . Hypothyroidism Mother   . Cancer Mother     uterine cancer  . Hyperlipidemia Father     Social History   Social History  . Marital Status: Married    Spouse Name: N/A  . Number of Children: 3  . Years of Education: N/A   Occupational History  . Elementary school curriculum facilitator     Maternity leave till 2/17   Social History Main Topics  . Smoking status: Never Smoker   . Smokeless tobacco: Never Used  . Alcohol Use: No  . Drug Use: No  . Sexual Activity: Yes    Birth Control/ Protection: None   Other Topics Concern  . Not  on file   Social History Narrative   Review of Systems No diarrhea--- 4-5 BMs but not loose No rash    Objective:   Physical Exam  Constitutional:  No distress but ill appearing. Supine and dizzy with sitting for 1 minute  HENT:  Mouth/Throat: Oropharynx is clear and moist. No oropharyngeal exudate.  Mild frontal tenderness TMs normal No sig nasal inflammation  Neck: Normal range of motion. Neck supple. No thyromegaly present.  Pulmonary/Chest: Effort normal and breath sounds normal. No respiratory distress. She has no wheezes. She has no rales.  Abdominal: Soft. Bowel sounds are normal. She exhibits no distension. There is no tenderness. There is no rebound and no guarding.  Abdomen is sensitive but not truly tender  Lymphadenopathy:    She has no cervical adenopathy.          Assessment & Plan:

## 2015-08-13 NOTE — Progress Notes (Signed)
Pre visit review using our clinic review tool, if applicable. No additional management support is needed unless otherwise documented below in the visit note. 

## 2015-08-13 NOTE — Patient Instructions (Signed)
Please take sips of water or other fluids every 5 minutes and then increase the amount once your nausea is gone. You can try the anti nausea medication if you can't drink If you get worse, please go to the emergency room for some IV fluids

## 2016-01-15 ENCOUNTER — Telehealth: Payer: Self-pay | Admitting: Family Medicine

## 2016-01-15 ENCOUNTER — Ambulatory Visit: Payer: BC Managed Care – PPO | Admitting: Family Medicine

## 2016-01-15 DIAGNOSIS — Z0289 Encounter for other administrative examinations: Secondary | ICD-10-CM

## 2016-01-15 NOTE — Telephone Encounter (Signed)
Patient did not come for their scheduled appointment today for pink eye  Please let me know if the patient needs to be contacted immediately for follow up or if no follow up is necessary.

## 2016-01-15 NOTE — Telephone Encounter (Signed)
No follow-up

## 2017-06-16 DIAGNOSIS — M545 Low back pain, unspecified: Secondary | ICD-10-CM | POA: Insufficient documentation

## 2017-06-16 DIAGNOSIS — M79673 Pain in unspecified foot: Secondary | ICD-10-CM | POA: Insufficient documentation

## 2017-08-01 HISTORY — PX: HAND SURGERY: SHX662

## 2018-07-07 ENCOUNTER — Emergency Department
Admission: EM | Admit: 2018-07-07 | Discharge: 2018-07-08 | Disposition: A | Discharge status: Home / Self Care | Payer: BC Managed Care – PPO | Attending: Emergency Medicine | Admitting: Emergency Medicine

## 2018-07-07 ENCOUNTER — Emergency Department: Payer: BC Managed Care – PPO

## 2018-07-07 DIAGNOSIS — Y9389 Activity, other specified: Secondary | ICD-10-CM | POA: Diagnosis not present

## 2018-07-07 DIAGNOSIS — W19XXXA Unspecified fall, initial encounter: Secondary | ICD-10-CM | POA: Diagnosis not present

## 2018-07-07 DIAGNOSIS — S62616A Displaced fracture of proximal phalanx of right little finger, initial encounter for closed fracture: Secondary | ICD-10-CM | POA: Diagnosis not present

## 2018-07-07 DIAGNOSIS — S6991XA Unspecified injury of right wrist, hand and finger(s), initial encounter: Secondary | ICD-10-CM | POA: Diagnosis present

## 2018-07-07 DIAGNOSIS — Y999 Unspecified external cause status: Secondary | ICD-10-CM | POA: Diagnosis not present

## 2018-07-07 DIAGNOSIS — Y92481 Parking lot as the place of occurrence of the external cause: Secondary | ICD-10-CM | POA: Diagnosis not present

## 2018-07-07 NOTE — ED Triage Notes (Signed)
Patient c/o right hand and 5th digit pain post mechanical fall this evening. Bruising and swelling noted to area.

## 2018-07-08 NOTE — ED Provider Notes (Signed)
[facility] Emergency Department Provider Note  ____________________________________________   First MD Initiated Contact with Patient 07/07/18 2321     (approximate)  I have reviewed the triage vital signs and the nursing notes.   HISTORY  Chief Complaint Hand Injury    HPI Stacy Burton is a 37 y.o. female with no contributory past medical history who presents for evaluation of pain and swelling in her right hand near the little finger.  She is right-hand dominant.  She had a mechanical fall while she was turning through a parking lot and caught herself with her hand.  That is the only area of injury.  She has a little bit of numbness in the finger and the hand with some swelling and bruising.  No laceration or abrasion.  She has decreased ability to flex the finger due to pain.  Past Medical History:  Diagnosis Date  . Allergy   . History of chicken pox   . Medical history non-contributory   . No pertinent past medical history     Patient Active Problem List   Diagnosis Date Noted  . Pleuritic chest pain 08/13/2015  . Dichorionic diamniotic twin pregnancy in third trimester 01/02/2015  . Viral URI 10/10/2014  . Left ear pain 09/30/2014    Past Surgical History:  Procedure Laterality Date  . APPENDECTOMY    . CESAREAN SECTION  11/28/2011   Procedure: CESAREAN SECTION;  Surgeon: Annamaria Helling, MD;  Location: [facility];  Service: Gynecology;  Laterality: N/A;  . CESAREAN SECTION N/A 01/02/2015   Procedure: CESAREAN SECTION;  Surgeon: Waynard Reeds, MD;  Location: [facility];  Service: Obstetrics;  Laterality: N/A;  EDD: 01/16/2015 NKDA   . WISDOM TOOTH EXTRACTION      Prior to Admission medications   Medication Sig Start Date End Date Taking? Authorizing Provider  ondansetron (ZOFRAN) 4 MG tablet Take 1 tablet (4 mg total) by mouth every 8 (eight) hours as needed for nausea or vomiting. 08/13/15   Karie Schwalbe, MD  PRENATAL VITAMINS PO Take by mouth.     [provider]  Probiotic Product (PROBIOTIC DAILY) CAPS Take by mouth.    [provider]    Allergies Patient has no known allergies.  Family History  Problem Relation Age of Onset  . Hypothyroidism Mother   . Cancer Mother        uterine cancer  . Hypertension Mother   . Hyperlipidemia Father   . Hypertension Father   . Depression Sister   . Mental retardation Sister   . Depression Maternal Grandfather   . Anesthesia problems Neg Hx     Social History Social History   Tobacco Use  . Smoking status: Never Smoker  . Smokeless tobacco: Never Used  Substance Use Topics  . Alcohol use: No    Alcohol/week: 0.0 standard drinks  . Drug use: No    Review of Systems Constitutional: No fever/chills Respiratory: Denies shortness of breath. Musculoskeletal: Pain and swelling in ulnar side of right hand and the little finger Integumentary: Negative for rash. Neurological: Negative for headaches, focal weakness or numbness.   ____________________________________________   PHYSICAL EXAM:  VITAL SIGNS: ED Triage Vitals  Enc Vitals Group     BP 07/07/18 2246 124/66     Pulse Rate 07/07/18 2246 69     Resp 07/07/18 2246 18     Temp 07/07/18 2246 98.2 F (36.8 C)     Temp Source 07/07/18 2246 Oral  SpO2 07/07/18 2246 100 %     Weight 07/07/18 2244 90.7 kg (200 lb)     Height 07/07/18 2244 1.778 m (5\' 10" )     Head Circumference --      Peak Flow --      Pain Score 07/07/18 2244 1     Pain Loc --      Pain Edu? --      Excl. in GC? --     Constitutional: Alert and oriented. Well appearing and in no acute distress. Eyes: Conjunctivae are normal.  Head: Atraumatic. Respiratory: Normal respiratory effort.  No retractions.  Musculoskeletal: Swelling and ecchymosis of the right little finger and the ulnar aspect of the right hand that is centered around the fifth MCP and fifth right proximal phalanx.  Neurovascularly intact except for a little  bit of numbness likely secondary to swelling. Neurologic:  Normal speech and language. No gross focal neurologic deficits are appreciated.  Skin:  Skin is warm, dry and intact. No rash noted. Psychiatric: Mood and affect are normal. Speech and behavior are normal.  ____________________________________________   LABS (all labs ordered are listed, but only abnormal results are displayed)  Labs Reviewed - No data to display ____________________________________________  EKG  None - EKG not ordered by ED physician ____________________________________________  RADIOLOGY I, Loleta Roseory Kenny Rea, personally viewed and evaluated these images (plain radiographs) as part of my medical decision making, as well as reviewing the written report by the radiologist.  ED MD interpretation: Transverse fracture of the proximal aspect of the little finger proximal phalanx with minimal displacement  Official radiology report(s): Dg Hand Complete Right  Result Date: 07/07/2018 CLINICAL DATA:  Pain and swelling in the little finger after a fall. EXAM: RIGHT HAND - COMPLETE 3+ VIEW COMPARISON:  None. FINDINGS: Three views study shows a transverse fracture through the proximal portion of the pinky finger proximal phalanx fracture fragments are distracted by about 2 mm. No other acute fracture identified. No worrisome lytic or sclerotic osseous abnormality. IMPRESSION: Transverse fracture involving the proximal aspect of the little finger proximal phalanx. Electronically Signed   By: Kennith CenterEric  Mansell M.D.   On: 07/07/2018 23:18    ____________________________________________   PROCEDURES  Critical Care performed: No   Procedure(s) performed:   .Splint Application Date/Time: 07/08/2018 1:09 AM Performed by: Loleta RoseForbach, Marithza Malachi, MD Authorized by: Loleta RoseForbach, Siani Utke, MD   Consent:    Consent obtained:  Verbal   Consent given by:  Patient   Risks discussed:  Discoloration, numbness, pain and swelling   Alternatives  discussed:  No treatment Pre-procedure details:    Sensation:  Numbness   Skin color:  Ecchymotic Procedure details:    Laterality:  Right   Location:  Hand   Hand:  R hand   Splint type:  Ulnar gutter   Supplies:  Ortho-Glass Post-procedure details:    Pain:  Unchanged   Sensation:  Unchanged   Patient tolerance of procedure:  Tolerated well, no immediate complications Comments:     Placed by ED tech, verified by MD after placement     ____________________________________________   INITIAL IMPRESSION / ASSESSMENT AND PLAN / ED COURSE  As part of my medical decision making, I reviewed the following data within the electronic MEDICAL RECORD NUMBER Nursing notes reviewed and incorporated and Radiograph reviewed     Right little finger proximal phalanx fracture.  I spoke briefly with orthopedics on the phone and he agreed with my plan for an ulnar gutter splint and outpatient  follow-up next week.  The patient is in no significant distress and neurovascularly intact after splint placement.  I gave my usual customary recommendations and follow-up precautions.  She understands and agrees with the plan.     ____________________________________________  FINAL CLINICAL IMPRESSION(S) / ED DIAGNOSES  Final diagnoses:  Closed displaced fracture of proximal phalanx of right little finger, initial encounter     MEDICATIONS GIVEN DURING THIS VISIT:  Medications - No data to display   ED Discharge Orders    None       Note:  This document was prepared using Dragon voice recognition software and may include unintentional dictation errors.    Loleta Rose, MD 07/08/18 [phone]

## 2018-07-08 NOTE — Discharge Instructions (Addendum)
Please read through the included information about splint care (keep it clean and dry).  If your pain becomes much more severe, the splint becomes too tight, or you feel as if your injured limb is becoming numb or cold, please return immediately to the Emergency Department.  Follow up with the orthopedics specialist listed in this paperwork.  When possible, keep your splint elevated to help with the swelling.  You may also use ice packs over the splint, as well as over-the-counter pain medication.

## 2018-07-10 DIAGNOSIS — S62616A Displaced fracture of proximal phalanx of right little finger, initial encounter for closed fracture: Secondary | ICD-10-CM | POA: Insufficient documentation

## 2019-04-05 ENCOUNTER — Other Ambulatory Visit: Payer: Self-pay

## 2019-04-05 DIAGNOSIS — Z20822 Contact with and (suspected) exposure to covid-19: Secondary | ICD-10-CM

## 2019-04-07 LAB — NOVEL CORONAVIRUS, NAA: SARS-CoV-2, NAA: NOT DETECTED

## 2019-08-09 IMAGING — CR DG HAND COMPLETE 3+V*R*
1 series · 3 of 3 positions shown · non-contrast
Comparison: None.

CLINICAL DATA: Pain and swelling in the little finger after a fall.

EXAM:
RIGHT HAND - COMPLETE 3+ VIEW

[Series 1: dg hand complete right · 0.14mm/px · 3 of 3 slices shown]
[im 1/3]
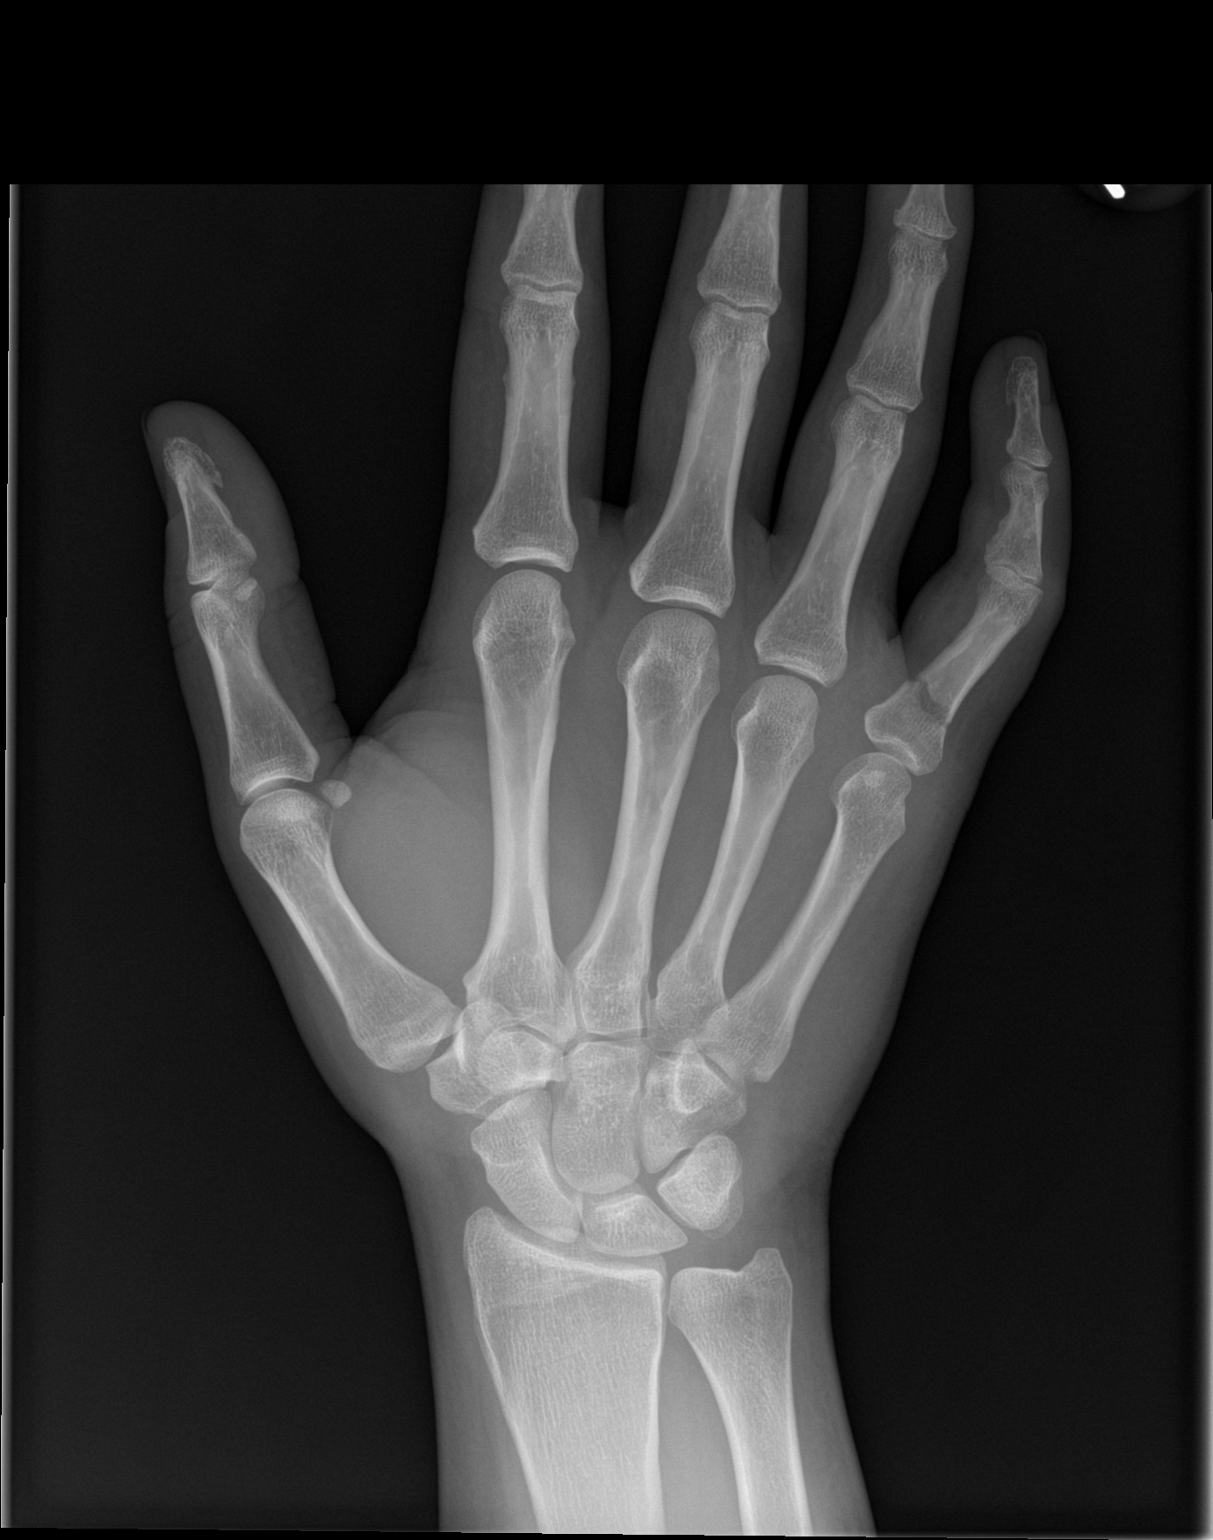
[im 2/3]
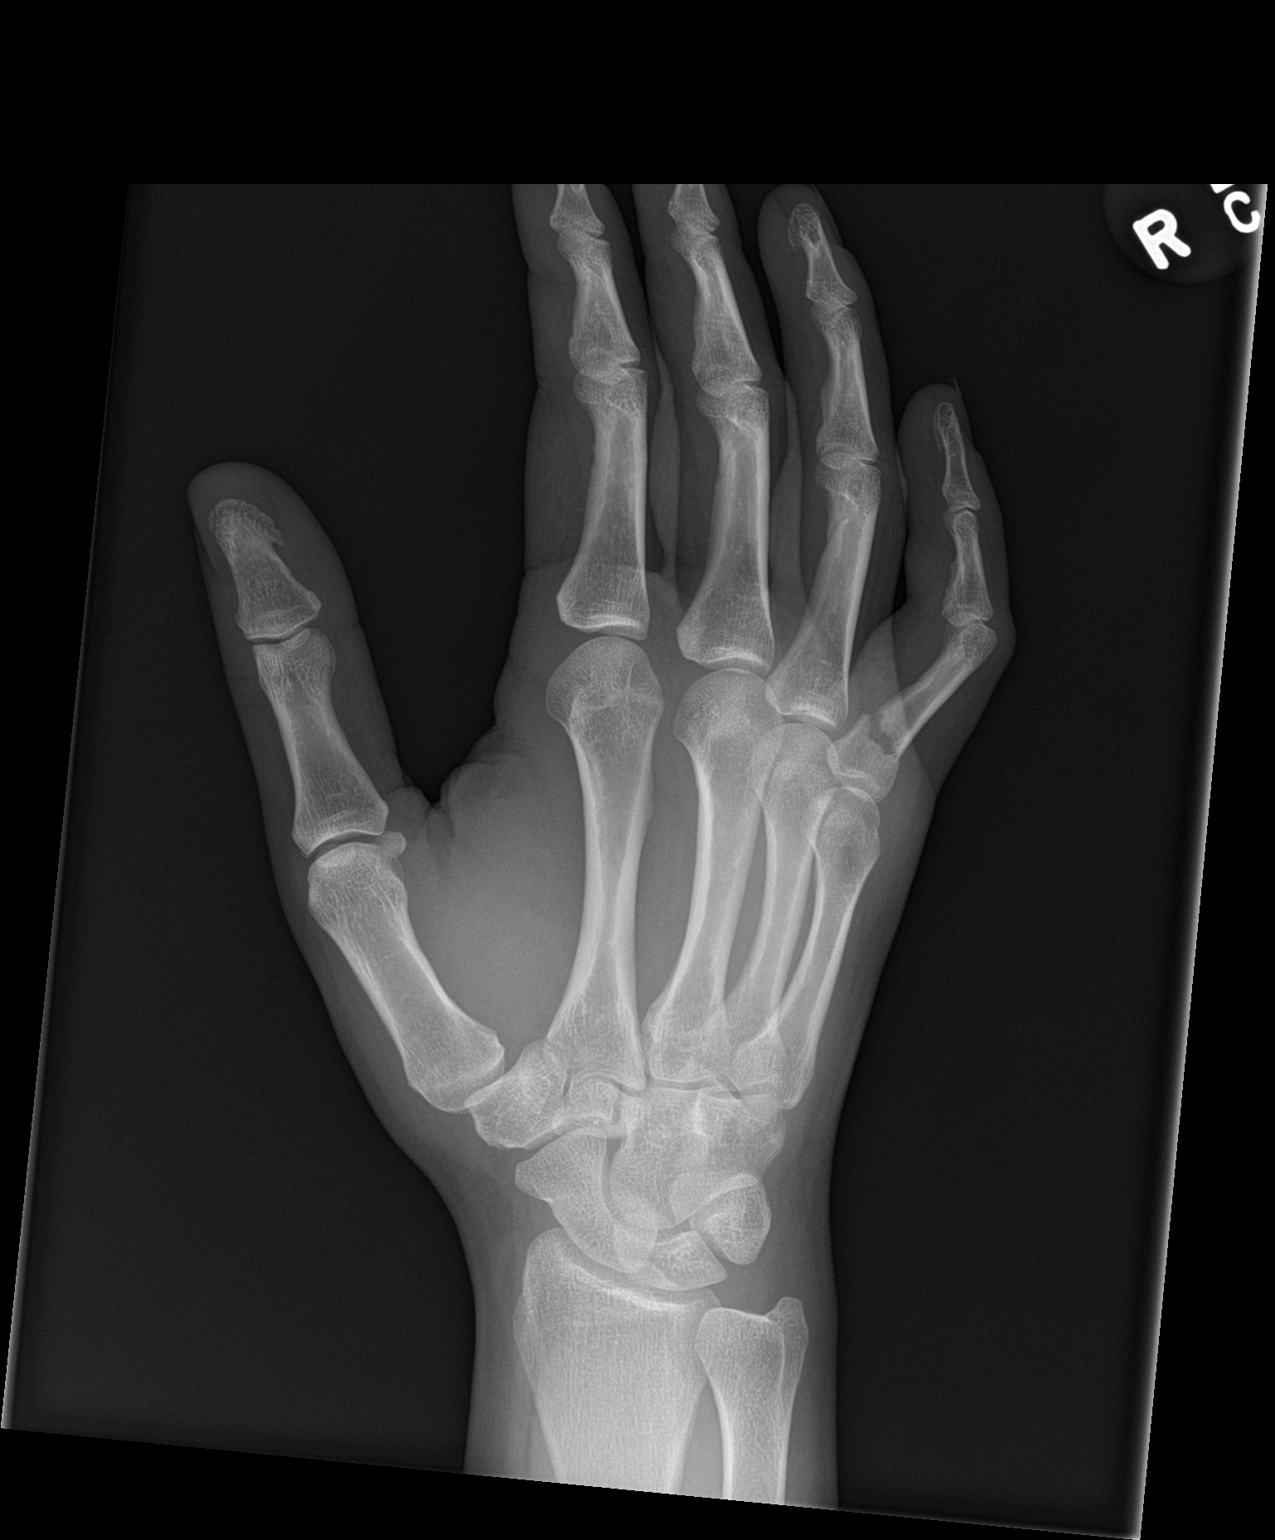
[im 3/3]
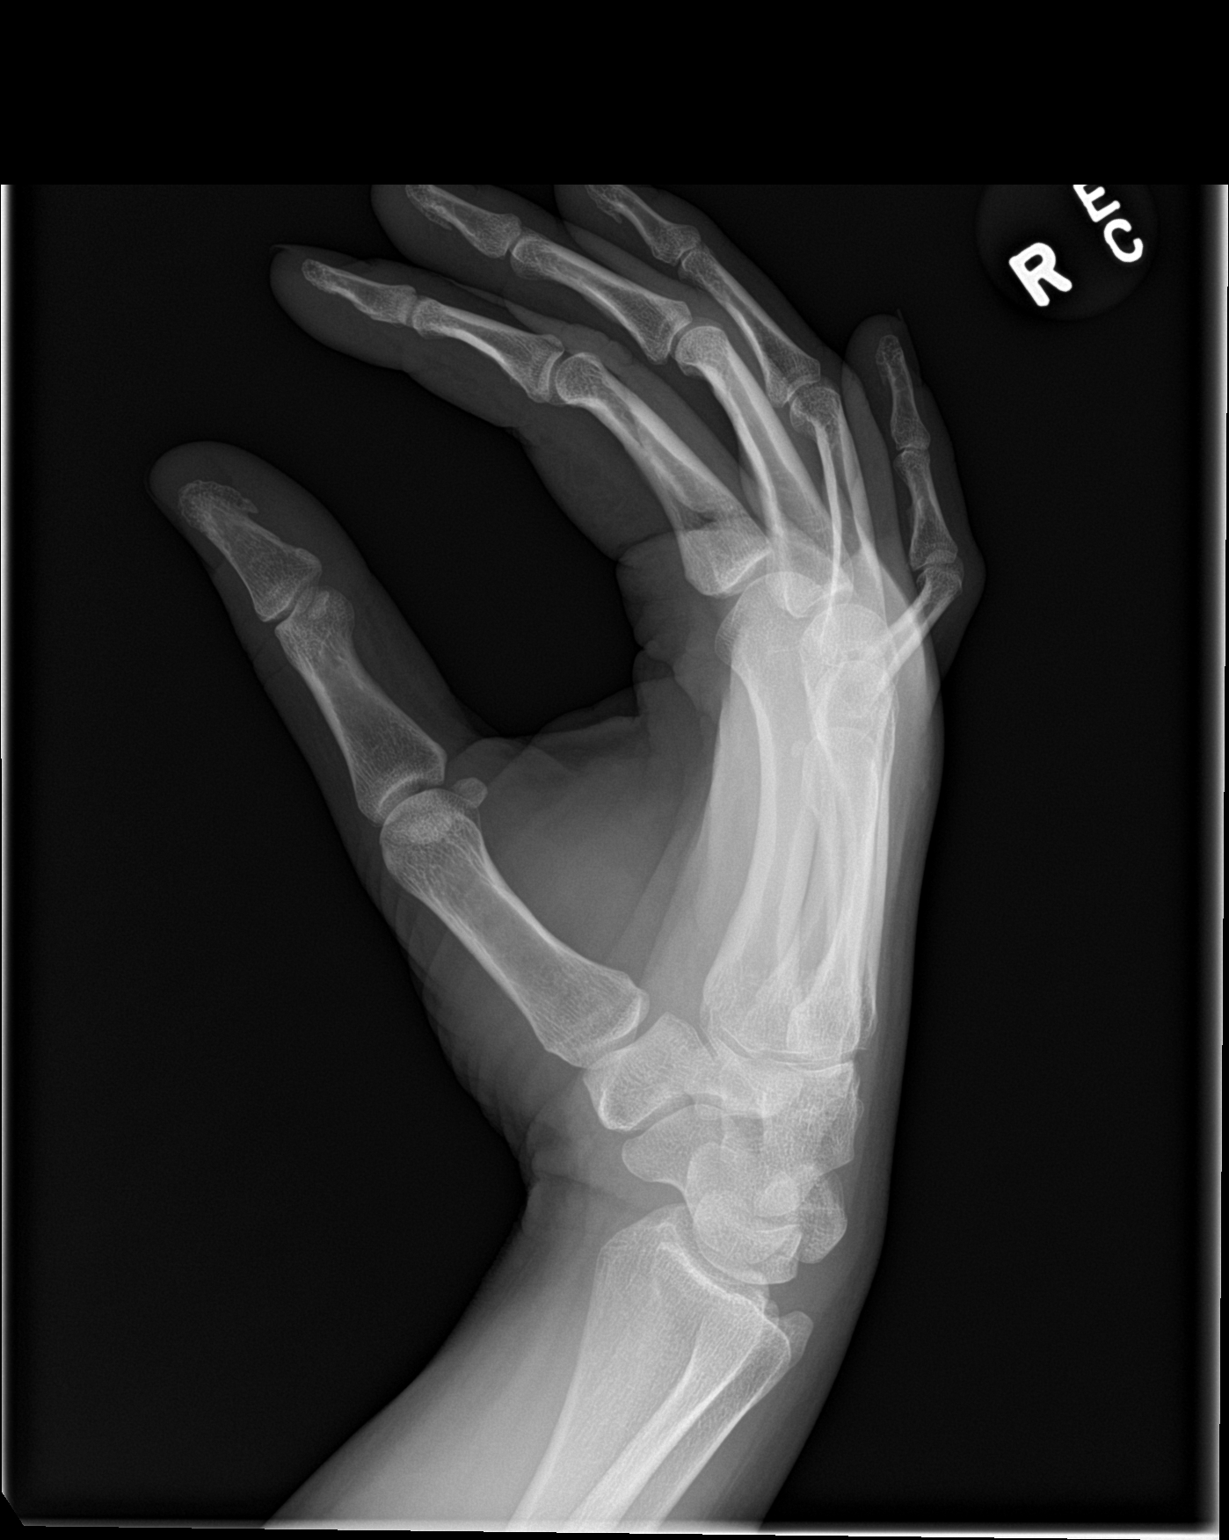

[3 of 3 positions shown; findings below may reference images not displayed]

FINDINGS: Three views study shows a transverse fracture through the proximal
portion of the pinky finger proximal phalanx fracture fragments are
distracted by about 2 mm. No other acute fracture identified. No
worrisome lytic or sclerotic osseous abnormality.
IMPRESSION: Transverse fracture involving the proximal aspect of the little
finger proximal phalanx.

## 2022-03-11 ENCOUNTER — Ambulatory Visit: Payer: BC Managed Care – PPO | Admitting: Family

## 2022-03-11 ENCOUNTER — Encounter: Payer: Self-pay | Admitting: Family

## 2022-03-11 VITALS — BP 102/72 | HR 73 | Temp 98.6°F | Resp 16 | Ht 70.5 in | Wt 200.4 lb

## 2022-03-11 DIAGNOSIS — E7800 Pure hypercholesterolemia, unspecified: Secondary | ICD-10-CM | POA: Diagnosis not present

## 2022-03-11 DIAGNOSIS — Z0001 Encounter for general adult medical examination with abnormal findings: Secondary | ICD-10-CM | POA: Diagnosis not present

## 2022-03-11 DIAGNOSIS — R5383 Other fatigue: Secondary | ICD-10-CM | POA: Insufficient documentation

## 2022-03-11 LAB — CBC WITH DIFFERENTIAL/PLATELET
Basophils Absolute: 53 cells/uL (ref 0–200)
Eosinophils Absolute: 123 cells/uL (ref 15–500)
HCT: 41.1 % (ref 35.0–45.0)
Lymphs Abs: 3071 cells/uL (ref 850–3900)
MCH: 30.1 pg (ref 27.0–33.0)
MCHC: 34.5 g/dL (ref 32.0–36.0)
Neutro Abs: 4902 cells/uL (ref 1500–7800)
Neutrophils Relative %: 55.7 %
Platelets: 295 10*3/uL (ref 140–400)
RBC: 4.71 10*6/uL (ref 3.80–5.10)

## 2022-03-11 NOTE — Patient Instructions (Signed)
  Welcome to our clinic, I am happy to have you as my new patient. I am excited to continue on this healthcare journey with you.  Stop by the lab prior to leaving today. I will notify you of your results once received.   Please keep in mind Any my chart messages you send have up to a three business day turnaround for a response.  Phone calls may take up to a one full business day turnaround for a  response.   If you need a medication refill I recommend you request it through the pharmacy as this is easiest for us rather than sending a message and or phone call.   Due to recent changes in healthcare laws, you may see results of your imaging and/or laboratory studies on MyChart before I have had a chance to review them.  I understand that in some cases there may be results that are confusing or concerning to you. Please understand that not all results are received at the same time and often I may need to interpret multiple results in order to provide you with the best plan of care or course of treatment. Therefore, I ask that you please give me 2 business days to thoroughly review all your results before contacting my office for clarification. Should we see a critical lab result, you will be contacted sooner.   It was a pleasure seeing you today! Please do not hesitate to reach out with any questions and or concerns.  Regards,   Deriyah Kunath FNP-C  

## 2022-03-11 NOTE — Assessment & Plan Note (Signed)
Lipid panel ordered pending results.   

## 2022-03-11 NOTE — Assessment & Plan Note (Signed)
fatigue lab work up pending results

## 2022-03-11 NOTE — Assessment & Plan Note (Signed)
Patient Counseling(The following topics were reviewed): ? Preventative care handout given to pt  ?Health maintenance and immunizations reviewed. Please refer to Health maintenance section. ?Pt advised on safe sex, wearing seatbelts in car, and proper nutrition ?labwork ordered today for annual ?Dental health: Discussed importance of regular tooth brushing, flossing, and dental visits. ? ? ?

## 2022-03-11 NOTE — Progress Notes (Signed)
New Patient Office Visit  Subjective:  Patient ID: Stacy Burton, female    DOB: [birth year]  Age: 41 y.o. MRN: [id number]  CC:  Chief Complaint  Patient presents with   Establish Care    HPI Stacy Burton is here to establish care as a new patient.  Prior provider was: Dr. Ruthe Mannan, MD  Pt is without acute concerns.   [facility]  2022, negative.  Mammogram: 04/2021 negative      03/11/2022    2:36 PM 06/10/2013    9:47 AM  PHQ9 SCORE ONLY  PHQ-9 Total Score 3 0     chronic concerns:   Past Medical History:  Diagnosis Date   Allergy    History of chicken pox    Medical history non-contributory    No pertinent past medical history     Past Surgical History:  Procedure Laterality Date   APPENDECTOMY     CESAREAN SECTION  11/28/2011   Procedure: CESAREAN SECTION;  Surgeon: Annamaria Helling, MD;  Location: [facility];  Service: Gynecology;  Laterality: N/A;   CESAREAN SECTION N/A 01/02/2015   Procedure: CESAREAN SECTION;  Surgeon: Waynard Reeds, MD;  Location: [facility];  Service: Obstetrics;  Laterality: N/A;  EDD: 01/16/2015 NKDA    HAND SURGERY  2019   WISDOM TOOTH EXTRACTION      Family History  Problem Relation Age of Onset   Hyperlipidemia Mother    Arthritis Mother    Hypothyroidism Mother    Uterine cancer Mother    Hypertension Mother    Hyperlipidemia Father    Hypertension Father    Depression Sister    Depression Maternal Grandmother    Cancer Maternal Grandmother        stomach liver ?   Heart attack Maternal Grandfather        30s?   Cirrhosis Paternal Grandmother    Anesthesia problems Neg Hx     Social History   Socioeconomic History   Marital status: Married    Spouse name: Not on file   Number of children: 3   Years of education: Not on file   Highest education level: Not on file  Occupational History   Occupation: Chief Executive Officer school curriculum facilitator  Tobacco Use   Smoking status: Never   Smokeless tobacco: Never   Vaping Use   Vaping Use: Never used  Substance and Sexual Activity   Alcohol use: Yes    Comment: soc   Drug use: No   Sexual activity: Yes    Partners: Male    Birth control/protection: None, Pill  Other Topics Concern   Not on file  Social History Narrative   Five year old daughter   Twins, one girl one boy    Social Determinants of Corporate investment banker Strain: Not on file  Food Insecurity: Not on file  Transportation Needs: Not on file  Physical Activity: Not on file  Stress: Not on file  Social Connections: Not on file  Intimate Partner Violence: Not on file    Outpatient Medications Prior to Visit  Medication Sig Dispense Refill   Cetirizine HCl (ZYRTEC ALLERGY) 10 MG CAPS      Collagen-Vitamin C-Biotin (COLLAGEN 1500/C PO)      Levonorgestrel-Ethinyl Estradiol (AMETHIA) 0.15-0.03 &0.01 MG tablet Take 1 tablet by mouth daily.     Probiotic Product (PROBIOTIC DAILY) CAPS Take by mouth.     ondansetron (ZOFRAN) 4 MG tablet Take 1 tablet (4 mg total) by  mouth every 8 (eight) hours as needed for nausea or vomiting. 10 tablet 0   PRENATAL VITAMINS PO Take by mouth.     No facility-administered medications prior to visit.    No Known Allergies  ROS Review of Systems  Review of Systems  Respiratory:  Negative for shortness of breath.   Cardiovascular:  Negative for chest pain and palpitations.  Gastrointestinal:  Negative for constipation and diarrhea.  Genitourinary:  Negative for dysuria, frequency and urgency.  Musculoskeletal:  Negative for myalgias.  Psychiatric/Behavioral:  Negative for depression and suicidal ideas.   All other systems reviewed and are negative.    Objective:    Physical Exam  Gen: NAD, resting comfortably CV: RRR with no murmurs appreciated Pulm: NWOB, CTAB with no crackles, wheezes, or rhonchi Skin: warm, dry Psych: Normal affect and thought content  BP 102/72   Pulse 73   Temp 98.6 F (37 C)   Resp 16   Ht 5' 10.5"  (1.791 m)   Wt 200 lb 6 oz (90.9 kg)   SpO2 98%   BMI 28.34 kg/m  Wt Readings from Last 3 Encounters:  03/11/22 200 lb 6 oz (90.9 kg)  07/07/18 200 lb (90.7 kg)  08/13/15 191 lb (86.6 kg)     Health Maintenance Due  Topic Date Due   Hepatitis C Screening  Never done   PAP SMEAR-Modifier  03/22/2021   INFLUENZA VACCINE  03/01/2022    There are no preventive care reminders to display for this patient.  Lab Results  Component Value Date   TSH 0.50 06/10/2013   Lab Results  Component Value Date   WBC 13.3 (H) 01/03/2015   HGB 9.8 (L) 01/03/2015   HCT 29.5 (L) 01/03/2015   MCV 90.8 01/03/2015   PLT 161 01/03/2015   Lab Results  Component Value Date   NA 138 06/10/2013   K 3.9 06/10/2013   CO2 28 06/10/2013   GLUCOSE 88 06/10/2013   BUN 13 06/10/2013   CREATININE 0.8 06/10/2013   BILITOT 0.5 06/10/2013   ALKPHOS 55 06/10/2013   AST 14 06/10/2013   ALT 13 06/10/2013   PROT 7.4 06/10/2013   ALBUMIN 4.0 06/10/2013   CALCIUM 9.4 06/10/2013   GFR 94.52 06/10/2013   Lab Results  Component Value Date   CHOL 164 12/12/2013   Lab Results  Component Value Date   HDL 39.70 12/12/2013   Lab Results  Component Value Date   LDLCALC 104 (H) 12/12/2013   Lab Results  Component Value Date   TRIG 102.0 12/12/2013   Lab Results  Component Value Date   CHOLHDL 4 12/12/2013   No results found for: "HGBA1C"    Assessment & Plan:   Problem List Items Addressed This Visit       Other   Elevated LDL cholesterol level - Primary    Lipid panel ordered pending results.        Relevant Orders   Lipid panel   Other fatigue    fatigue lab work up pending results       Relevant Orders   TSH   Vitamin B12   Encounter for general adult medical examination with abnormal findings    Patient Counseling(The following topics were reviewed):  Preventative care handout given to pt  Health maintenance and immunizations reviewed. Please refer to Health maintenance  section. Pt advised on safe sex, wearing seatbelts in car, and proper nutrition labwork ordered today for annual Dental health: Discussed importance of regular  tooth brushing, flossing, and dental visits.        Relevant Orders   CBC with Differential/Platelet   Comprehensive metabolic panel   Lipid panel    No orders of the defined types were placed in this encounter.   Follow-up: Return in about 1 year (around 03/12/2023) for annual follow up or prn .    Mort Sawyers, FNP

## 2022-03-12 LAB — CBC WITH DIFFERENTIAL/PLATELET
Absolute Monocytes: 651 cells/uL (ref 200–950)
Basophils Relative: 0.6 %
Eosinophils Relative: 1.4 %
Hemoglobin: 14.2 g/dL (ref 11.7–15.5)
MCV: 87.3 fL (ref 80.0–100.0)
MPV: 9.7 fL (ref 7.5–12.5)
Monocytes Relative: 7.4 %
RDW: 12.1 % (ref 11.0–15.0)
Total Lymphocyte: 34.9 %
WBC: 8.8 10*3/uL (ref 3.8–10.8)

## 2022-03-12 LAB — COMPREHENSIVE METABOLIC PANEL
AG Ratio: 1.3 (calc) (ref 1.0–2.5)
ALT: 27 U/L (ref 6–29)
AST: 19 U/L (ref 10–30)
Albumin: 4.1 g/dL (ref 3.6–5.1)
Alkaline phosphatase (APISO): 58 U/L (ref 31–125)
BUN: 10 mg/dL (ref 7–25)
CO2: 28 mmol/L (ref 20–32)
Calcium: 9.5 mg/dL (ref 8.6–10.2)
Chloride: 102 mmol/L (ref 98–110)
Creat: 0.8 mg/dL (ref 0.50–0.99)
Globulin: 3.2 g/dL (calc) (ref 1.9–3.7)
Glucose, Bld: 73 mg/dL (ref 65–99)
Potassium: 3.9 mmol/L (ref 3.5–5.3)
Sodium: 139 mmol/L (ref 135–146)
Total Bilirubin: 0.3 mg/dL (ref 0.2–1.2)
Total Protein: 7.3 g/dL (ref 6.1–8.1)

## 2022-03-12 LAB — TSH: TSH: 1.06 mIU/L

## 2022-03-12 LAB — LIPID PANEL
Cholesterol: 213 mg/dL — ABNORMAL HIGH (ref <200)
HDL: 46 mg/dL — ABNORMAL LOW (ref >=50)
LDL Cholesterol (Calc): 140 mg/dL (calc) — ABNORMAL HIGH
Non-HDL Cholesterol (Calc): 167 mg/dL (calc) — ABNORMAL HIGH (ref <130)
Total CHOL/HDL Ratio: 4.6 (calc) (ref <5.0)
Triglycerides: 138 mg/dL (ref <150)

## 2022-03-12 LAB — VITAMIN B12: Vitamin B-12: 377 pg/mL (ref 200–1100)

## 2022-03-14 ENCOUNTER — Encounter: Payer: Self-pay | Admitting: Family

## 2022-03-30 ENCOUNTER — Encounter: Payer: Self-pay | Admitting: Family

## 2022-09-01 ENCOUNTER — Ambulatory Visit (INDEPENDENT_AMBULATORY_CARE_PROVIDER_SITE_OTHER): Payer: BC Managed Care – PPO | Admitting: Family

## 2022-09-01 ENCOUNTER — Encounter: Payer: Self-pay | Admitting: Family

## 2022-09-01 VITALS — BP 118/70 | HR 68 | Temp 98.2°F | Ht 70.5 in | Wt 206.0 lb

## 2022-09-01 DIAGNOSIS — J029 Acute pharyngitis, unspecified: Secondary | ICD-10-CM | POA: Diagnosis not present

## 2022-09-01 DIAGNOSIS — J020 Streptococcal pharyngitis: Secondary | ICD-10-CM | POA: Insufficient documentation

## 2022-09-01 LAB — POCT RAPID STREP A (OFFICE): Rapid Strep A Screen: POSITIVE — AB

## 2022-09-01 MED ORDER — AMOXICILLIN 500 MG PO CAPS: 500.0000 mg | ORAL_CAPSULE | Freq: Two times a day (BID) | ORAL | 0 refills | Status: AC

## 2022-09-01 NOTE — Progress Notes (Signed)
Established Patient Office Visit  Subjective:   Patient ID: Stacy Burton, female    DOB: [birth year]  Age: 42 y.o. MRN: [id number]  CC:  Chief Complaint  Patient presents with   Sore Throat    2 days. Diarrhea and stomach pain since last week.    HPI: Stacy Burton is a 42 y.o. female presenting on 09/01/2022 for Sore Throat (2 days. Diarrhea and stomach pain since last week.)  Sick last week along with her son, and they were sick with diarrhea and abdominal upset. Today she does have sore throat that started last night.  No nasal congestion, sinus pressure, ear pain . No cough or chest congestion. Diarrhea and abdominal pain has mostly resolved. Slight nausea but also in the beginning of the month changed ocp and now with nausea.       ROS: Negative unless specifically indicated above in HPI.   Relevant past medical history reviewed and updated as indicated.   Allergies and medications reviewed and updated.   Current Outpatient Medications:    amoxicillin (AMOXIL) 500 MG capsule, Take 1 capsule (500 mg total) by mouth 2 (two) times daily for 10 days., Disp: 20 capsule, Rfl: 0   Cetirizine HCl (ZYRTEC ALLERGY) 10 MG CAPS, , Disp: , Rfl:    LOW-OGESTREL 0.3-30 MG-MCG tablet, Take 1 tablet by mouth daily., Disp: , Rfl:    Probiotic Product (PROBIOTIC DAILY) CAPS, Take by mouth., Disp: , Rfl:   No Known Allergies  Objective:   BP 118/70   Pulse 68   Temp 98.2 F (36.8 C) (Temporal)   Ht 5' 10.5" (1.791 m)   Wt 206 lb (93.4 kg)   SpO2 99%   BMI 29.14 kg/m    Physical Exam Constitutional:      General: She is not in acute distress.    Appearance: Normal appearance. She is well-developed. She is obese. She is not ill-appearing, toxic-appearing or diaphoretic.  HENT:     Head: Normocephalic.     Right Ear: Tympanic membrane normal.     Left Ear: Tympanic membrane normal.     Nose: Nose normal.     Mouth/Throat:     Mouth: Mucous membranes are dry.     Pharynx: No  oropharyngeal exudate or posterior oropharyngeal erythema.  Eyes:     Extraocular Movements: Extraocular movements intact.     Pupils: Pupils are equal, round, and reactive to light.  Cardiovascular:     Rate and Rhythm: Normal rate and regular rhythm.     Pulses: Normal pulses.     Heart sounds: Normal heart sounds.  Pulmonary:     Effort: Pulmonary effort is normal.     Breath sounds: Normal breath sounds.  Musculoskeletal:     Cervical back: Normal range of motion.  Neurological:     General: No focal deficit present.     Mental Status: She is alert and oriented to person, place, and time. Mental status is at baseline.  Psychiatric:        Mood and Affect: Mood normal.        Behavior: Behavior normal.        Thought Content: Thought content normal.        Judgment: Judgment normal.     Assessment & Plan:  Sore throat -     POCT rapid strep A  Strep pharyngitis Assessment & Plan: Strep tested positive in office.  rx for amox 500 mg po bid x 10 days.  Ibuprofen/tyelnol prn sore throat/fever Pt told to F/u if no improvement in the next 2-3 days.   Orders: -     Amoxicillin; Take 1 capsule (500 mg total) by mouth 2 (two) times daily for 10 days.  Dispense: 20 capsule; Refill: 0     Follow up plan: Return for as scheduled for one year cpe.  Eugenia Pancoast, FNP

## 2022-09-01 NOTE — Patient Instructions (Signed)
------------------------------------    You were found to be strep positive,  Take antibiotics that have been sent to the pharmacy.  Change your toothbrush after 24 hours on the antibiotics.  Gargle with warm salt water as needed for sore throat.    Regards,   Rakeisha Nyce FNP-C   

## 2022-09-01 NOTE — Assessment & Plan Note (Signed)
Strep tested positive in office.  rx for amox 500 mg po bid x 10 days.  Ibuprofen/tyelnol prn sore throat/fever Pt told to F/u if no improvement in the next 2-3 days.  

## 2022-10-21 ENCOUNTER — Ambulatory Visit (INDEPENDENT_AMBULATORY_CARE_PROVIDER_SITE_OTHER): Payer: BC Managed Care – PPO | Admitting: Family

## 2022-10-21 ENCOUNTER — Encounter: Payer: Self-pay | Admitting: Family

## 2022-10-21 VITALS — BP 114/70 | HR 76 | Temp 97.9°F | Ht 70.5 in | Wt 206.6 lb

## 2022-10-21 DIAGNOSIS — J020 Streptococcal pharyngitis: Secondary | ICD-10-CM | POA: Diagnosis not present

## 2022-10-21 DIAGNOSIS — J029 Acute pharyngitis, unspecified: Secondary | ICD-10-CM

## 2022-10-21 LAB — POCT RAPID STREP A (OFFICE): Rapid Strep A Screen: POSITIVE — AB

## 2022-10-21 MED ORDER — AMOXICILLIN-POT CLAVULANATE 875-125 MG PO TABS: 1.0000 | ORAL_TABLET | Freq: Two times a day (BID) | ORAL | 0 refills | Status: DC

## 2022-10-21 NOTE — Patient Instructions (Signed)
------------------------------------    You were found to be strep positive,  Take antibiotics that have been sent to the pharmacy.  Change your toothbrush after 24 hours on the antibiotics.  Gargle with warm salt water as needed for sore throat.   ------------------------------------  

## 2022-10-21 NOTE — Progress Notes (Signed)
Established Patient Office Visit  Subjective:   Patient ID: Stacy Burton, female    DOB: [birth year]  Age: 42 y.o. MRN: [id number]  CC:  Chief Complaint  Patient presents with   Sore Throat    HPI: Stacy Burton is a 41 y.o. female presenting on 10/21/2022 for Sore Throat   Sore Throat     Symptoms of sore throat started about two days ago. She kept getting up from sleeping becusae her stomach was upset. She has slight nasal congestion. No cough or chest congestion. No fever or chills. Ears feel ok.        ROS: Negative unless specifically indicated above in HPI.   Relevant past medical history reviewed and updated as indicated.   Allergies and medications reviewed and updated.   Current Outpatient Medications:    amoxicillin-clavulanate (AUGMENTIN) 875-125 MG tablet, Take 1 tablet by mouth 2 (two) times daily., Disp: 20 tablet, Rfl: 0   Cetirizine HCl (ZYRTEC ALLERGY) 10 MG CAPS, , Disp: , Rfl:    Probiotic Product (PROBIOTIC DAILY) CAPS, Take by mouth., Disp: , Rfl:   No Known Allergies  Objective:   BP 114/70   Pulse 76   Temp 97.9 F (36.6 C) (Temporal)   Ht 5' 10.5" (1.791 m)   Wt 206 lb 9.6 oz (93.7 kg)   LMP 10/03/2022   SpO2 99%   BMI 29.23 kg/m    Physical Exam Constitutional:      General: She is not in acute distress.    Appearance: Normal appearance. She is normal weight. She is not ill-appearing, toxic-appearing or diaphoretic.  HENT:     Head: Normocephalic.     Right Ear: Tympanic membrane normal.     Left Ear: Tympanic membrane normal.     Nose: Nose normal.     Mouth/Throat:     Mouth: Mucous membranes are dry.     Pharynx: Posterior oropharyngeal erythema present. No oropharyngeal exudate.  Eyes:     Extraocular Movements: Extraocular movements intact.     Pupils: Pupils are equal, round, and reactive to light.  Cardiovascular:     Rate and Rhythm: Normal rate and regular rhythm.     Pulses: Normal pulses.     Heart sounds:  Normal heart sounds.  Pulmonary:     Effort: Pulmonary effort is normal.     Breath sounds: Normal breath sounds.  Musculoskeletal:     Cervical back: Normal range of motion.  Neurological:     General: No focal deficit present.     Mental Status: She is alert and oriented to person, place, and time. Mental status is at baseline.  Psychiatric:        Mood and Affect: Mood normal.        Behavior: Behavior normal.        Thought Content: Thought content normal.        Judgment: Judgment normal.     Assessment & Plan:  Sore throat -     POCT rapid strep A  Strep pharyngitis Assessment & Plan: Strep tested positive in office.  rx augmentin 875/125 mg po bid x 10 days Ibuprofen/tyelnol prn sore throat/fever Pt told to F/u if no improvement in the next 2-3 days.   Orders: -     Amoxicillin-Pot Clavulanate; Take 1 tablet by mouth 2 (two) times daily.  Dispense: 20 tablet; Refill: 0     Follow up plan: Return if symptoms worsen or fail to improve.  Eugenia Pancoast,  FNP 

## 2022-10-21 NOTE — Assessment & Plan Note (Signed)
Strep tested positive in office.  rx augmentin 875/125 mg po bid x 10 days Ibuprofen/tyelnol prn sore throat/fever Pt told to F/u if no improvement in the next 2-3 days.  

## 2023-02-09 ENCOUNTER — Encounter: Payer: Self-pay | Admitting: Family

## 2023-02-09 ENCOUNTER — Ambulatory Visit: Payer: BC Managed Care – PPO | Admitting: Family

## 2023-02-09 VITALS — BP 110/70 | HR 62 | Temp 97.6°F | Ht 70.5 in | Wt 204.0 lb

## 2023-02-09 DIAGNOSIS — J029 Acute pharyngitis, unspecified: Secondary | ICD-10-CM

## 2023-02-09 DIAGNOSIS — J020 Streptococcal pharyngitis: Secondary | ICD-10-CM | POA: Diagnosis not present

## 2023-02-09 LAB — POCT RAPID STREP A (OFFICE): Rapid Strep A Screen: POSITIVE — AB

## 2023-02-09 MED ORDER — AMOXICILLIN 500 MG PO CAPS: 500.0000 mg | ORAL_CAPSULE | Freq: Two times a day (BID) | ORAL | 0 refills | Status: AC

## 2023-02-09 NOTE — Progress Notes (Signed)
Established Patient Office Visit  Subjective:   Patient ID: Stacy Burton, female    DOB: 06/01/1981  Age: 42 y.o. MRN: [id number]  CC:  Chief Complaint  Patient presents with  . Sore Throat    1 week ago     HPI: Stacy Burton is a 42 y.o. female presenting on 02/09/2023 for Sore Throat (1 week ago )  About one week ago started with sore throat, and also some stomach uneasiness. Nausea. No vomiting. No heartburn. No ear pain. No cough.   No abdominal pain. No diarrhea. Slight constipation, skipping a day here and or there.        ROS: Negative unless specifically indicated above in HPI.   Relevant past medical history reviewed and updated as indicated.   Allergies and medications reviewed and updated.   Current Outpatient Medications:  .  amoxicillin (AMOXIL) 500 MG capsule, Take 1 capsule (500 mg total) by mouth 2 (two) times daily for 10 days., Disp: 20 capsule, Rfl: 0 .  Cetirizine HCl (ZYRTEC ALLERGY) 10 MG CAPS, , Disp: , Rfl:  .  Probiotic Product (PROBIOTIC DAILY) CAPS, Take by mouth., Disp: , Rfl:   No Known Allergies  Objective:   BP 110/70   Pulse 62   Temp 97.6 F (36.4 C) (Temporal)   Ht 5' 10.5" (1.791 m)   Wt 204 lb (92.5 kg)   BMI 28.86 kg/m    Physical Exam Constitutional:      General: She is not in acute distress.    Appearance: Normal appearance. She is normal weight. She is not ill-appearing, toxic-appearing or diaphoretic.  HENT:     Head: Normocephalic.     Right Ear: Tympanic membrane normal.     Left Ear: Tympanic membrane normal.     Nose: Nose normal.     Mouth/Throat:     Mouth: Mucous membranes are dry.     Pharynx: Posterior oropharyngeal erythema present. No oropharyngeal exudate.  Eyes:     Extraocular Movements: Extraocular movements intact.     Pupils: Pupils are equal, round, and reactive to light.  Cardiovascular:     Rate and Rhythm: Normal rate and regular rhythm.     Pulses: Normal pulses.     Heart sounds:  Normal heart sounds.  Pulmonary:     Effort: Pulmonary effort is normal.     Breath sounds: Normal breath sounds.  Musculoskeletal:     Cervical back: Normal range of motion.  Neurological:     General: No focal deficit present.     Mental Status: She is alert and oriented to person, place, and time. Mental status is at baseline.  Psychiatric:        Mood and Affect: Mood normal.        Behavior: Behavior normal.        Thought Content: Thought content normal.        Judgment: Judgment normal.    Assessment & Plan:  Sore throat -     POCT rapid strep A  Strep pharyngitis Assessment & Plan: Strep tested positive in office.  rx for amox 500 mg po bid x 10 days.  Ibuprofen/tyelnol prn sore throat/fever Pt told to F/u if no improvement in the next 2-3 days.   Orders: -     Amoxicillin; Take 1 capsule (500 mg total) by mouth 2 (two) times daily for 10 days.  Dispense: 20 capsule; Refill: 0     Follow up plan: Return if symptoms  worsen or fail to improve.  Mort Sawyers, FNP

## 2023-02-09 NOTE — Assessment & Plan Note (Signed)
Strep tested positive in office.  rx for amox 500 mg po bid x 10 days.  Ibuprofen/tyelnol prn sore throat/fever Pt told to F/u if no improvement in the next 2-3 days.  

## 2023-12-27 ENCOUNTER — Ambulatory Visit: Admitting: Family

## 2023-12-27 VITALS — BP 130/72 | HR 81 | Temp 98.6°F | Ht 70.5 in | Wt 206.0 lb

## 2023-12-27 DIAGNOSIS — F419 Anxiety disorder, unspecified: Secondary | ICD-10-CM

## 2023-12-27 DIAGNOSIS — E538 Deficiency of other specified B group vitamins: Secondary | ICD-10-CM | POA: Diagnosis not present

## 2023-12-27 DIAGNOSIS — E7800 Pure hypercholesterolemia, unspecified: Secondary | ICD-10-CM | POA: Diagnosis not present

## 2023-12-27 DIAGNOSIS — F410 Panic disorder [episodic paroxysmal anxiety] without agoraphobia: Secondary | ICD-10-CM

## 2023-12-27 DIAGNOSIS — N926 Irregular menstruation, unspecified: Secondary | ICD-10-CM | POA: Diagnosis not present

## 2023-12-27 DIAGNOSIS — F32A Depression, unspecified: Secondary | ICD-10-CM

## 2023-12-27 DIAGNOSIS — E88819 Insulin resistance, unspecified: Secondary | ICD-10-CM

## 2023-12-27 LAB — CBC
HCT: 40.9 % (ref 36.0–46.0)
Hemoglobin: 13.7 g/dL (ref 12.0–15.0)
MCHC: 33.4 g/dL (ref 30.0–36.0)
MCV: 87.2 fl (ref 78.0–100.0)
Platelets: 231 10*3/uL (ref 150.0–400.0)
RBC: 4.69 Mil/uL (ref 3.87–5.11)
RDW: 12.8 % (ref 11.5–15.5)
WBC: 7.5 10*3/uL (ref 4.0–10.5)

## 2023-12-27 LAB — LIPID PANEL
Cholesterol: 186 mg/dL (ref 0–200)
HDL: 51.2 mg/dL (ref >39.00)
LDL Cholesterol: 108 mg/dL — ABNORMAL HIGH (ref 0–99)
NonHDL: 134.94
Total CHOL/HDL Ratio: 4
Triglycerides: 135 mg/dL (ref 0.0–149.0)
VLDL: 27 mg/dL (ref 0.0–40.0)

## 2023-12-27 LAB — T3, FREE: T3, Free: 3.1 pg/mL (ref 2.3–4.2)

## 2023-12-27 LAB — HEMOGLOBIN A1C: Hgb A1c MFr Bld: 5.2 % (ref 4.6–6.5)

## 2023-12-27 LAB — T4, FREE: Free T4: 0.76 ng/dL (ref 0.60–1.60)

## 2023-12-27 LAB — VITAMIN B12: Vitamin B-12: 280 pg/mL (ref 211–911)

## 2023-12-27 LAB — TSH: TSH: 1.65 u[IU]/mL (ref 0.35–5.50)

## 2023-12-27 LAB — FOLLICLE STIMULATING HORMONE: FSH: 3.2 m[IU]/mL

## 2023-12-27 MED ORDER — ESCITALOPRAM OXALATE 10 MG PO TABS: 10.0000 mg | ORAL_TABLET | Freq: Every day | ORAL | 1 refills | Status: DC

## 2023-12-27 MED ORDER — HYDROXYZINE HCL 25 MG PO TABS
25.0000 mg | ORAL_TABLET | Freq: Three times a day (TID) | ORAL | 0 refills | Status: AC | PRN
Start: 2023-12-27 — End: ?

## 2023-12-27 NOTE — Patient Instructions (Addendum)
------------------------------------    Check out this location for therapy see if they take your insurance.  [facility]  tel: [phone]  ------------------------------------  Start lexapro 10 mg for anxiety and depression. Take 1/2 tablet by mouth once daily for about one week, then increase to 1 full tablet thereafter.   Taking the medicine as directed and not missing any doses is one of the best things you can do to treat your anxiety/depression.  Here are some things to keep in mind:  Side effects (stomach upset, some increased anxiety) may happen before you notice a benefit.  These side effects typically go away over time. Changes to your dose of medicine or a change in medication all together is sometimes necessary Many people will notice an improvement within two weeks but the full effect of the medication can take up to 4-6 weeks Stopping the medication when you start feeling better often results in a return of symptoms. Most people need to be on medication at least 6-12 months If you start having thoughts of hurting yourself or others after starting this medicine, please call me immediately.     -------------------------------  Hydroxyzine as needed.    ------------------------------------   Your imaging for transvaginal ultrasound of your pelvis Has been ordered at the following location.  Please call to schedule a time and date that would work for you.    [address], [place] Phone [phone],  8-430 pm

## 2023-12-27 NOTE — Progress Notes (Signed)
 Established Patient Office Visit  Subjective:      CC:  Chief Complaint  Patient presents with   Medical Management of Chronic Issues    Increased symptoms of irritability     HPI: Stacy Burton is a 43 y.o. female presenting on 12/27/2023 for Medical Management of Chronic Issues (Increased symptoms of irritability )  She has been experiencing increased irritability, sleep issues, and also increased stress which she states is making her feel physical stressors.  She only gets 1-2 good nights of sleep a week due to stressors.  She always feels tired and as though she hasn't rested enough.   She did recently stop birth control about one month ago. She was placed on this initially for an irregular period but it made her period worse where she had a period for six weeks in duration. She had been on this for only a few months. She states her period fluctuates between heavey and not heavy, and she states the irregularlity is every 1-3 months its never consistent. Sometimes will pass clots other times will be light. She does not get a lot of pain with periods just cramping, but she does get lower back pain and bloating that aggravates her lower back where she has a slipped disc. No h/o irregular pap, her mom has had uterine cancer.    She is seeing a therapist, so far doesn't feel connected and or if it is helpful but she is going to continue seeing them.   Has tried hailey, simpress, lo-estrin , and a few others. They cause 'super mood swings' and make her period irregularity worse.   Denies SI HI      Social history:  Relevant past medical, surgical, family and social history reviewed and updated as indicated. Interim medical history since our last visit reviewed.  Allergies and medications reviewed and updated.  DATA REVIEWED: CHART IN EPIC     ROS: Negative unless specifically indicated above in HPI.    Current Outpatient Medications:    Cetirizine HCl (ZYRTEC ALLERGY)  10 MG CAPS, , Disp: , Rfl:    escitalopram  (LEXAPRO ) 10 MG tablet, Take 1 tablet (10 mg total) by mouth daily., Disp: 30 tablet, Rfl: 1   hydrOXYzine  (ATARAX ) 25 MG tablet, Take 1 tablet (25 mg total) by mouth 3 (three) times daily as needed., Disp: 30 tablet, Rfl: 0      Objective:     BP 130/72   Pulse 81   Temp 98.6 F (37 C) (Temporal)   Ht 5' 10.5" (1.791 m)   Wt 206 lb (93.4 kg)   SpO2 99%   Breastfeeding No   BMI 29.14 kg/m   Wt Readings from Last 3 Encounters:  12/27/23 206 lb (93.4 kg)  02/09/23 204 lb (92.5 kg)  10/21/22 206 lb 9.6 oz (93.7 kg)    Physical Exam Constitutional:      General: She is not in acute distress.    Appearance: Normal appearance. She is normal weight. She is not ill-appearing, toxic-appearing or diaphoretic.  HENT:     Head: Normocephalic.  Cardiovascular:     Rate and Rhythm: Normal rate.  Pulmonary:     Effort: Pulmonary effort is normal.  Musculoskeletal:        General: Normal range of motion.  Neurological:     General: No focal deficit present.     Mental Status: She is alert and oriented to person, place, and time. Mental status is at baseline.  Psychiatric:  Mood and Affect: Mood normal. Affect is tearful.        Behavior: Behavior normal.        Thought Content: Thought content normal.        Judgment: Judgment normal.           Assessment & Plan:  Irregular periods/menstrual cycles Assessment & Plan: Workup in place Ordering lab work to include thyroid panel and hormonal R/o hormonal etiology, r/o pcos Ordering tranvsvaginal u/s r/o ovarian cysts, uterine fibroid or less likely neoplasm Pending results.   Orders: -     TSH -     Follicle stimulating hormone -     Prolactin -     Testos,Total,Free and SHBG (Female) -     T4, free -     T3, free -     US  PELVIC COMPLETE WITH TRANSVAGINAL; Future -     CBC  Panic attack -     hydrOXYzine  HCl; Take 1 tablet (25 mg total) by mouth 3 (three) times  daily as needed.  Dispense: 30 tablet; Refill: 0  Insulin resistance -     Hemoglobin A1c  Anxiety and depression Assessment & Plan: Currently not controlled Discussed options, recommended therapy   I instructed pt to start lexapro  1/2 tablet once daily for 1 week and then increase to a full tablet once daily on week two as tolerated.  We discussed common side effects such as nausea, drowsiness and weight gain.  Also discussed rare but serious side effect of suicidal ideation.  She is instructed to discontinue medication and go directly to ED if this occurs.  Pt verbalizes understanding.  Plan is to follow up in 30 days to evaluate progress.   Hydroxyzine  also as needed   Orders: -     Escitalopram  Oxalate; Take 1 tablet (10 mg total) by mouth daily.  Dispense: 30 tablet; Refill: 1  Low serum vitamin B12 Assessment & Plan: Ordered b12 pending results   Orders: -     Vitamin B12  Elevated LDL cholesterol level Assessment & Plan: Ordered lipid panel, pending results. Work on low cholesterol diet and exercise as tolerated   Orders: -     Lipid panel     Return in about 6 weeks (around 02/07/2024) for f/u anxiety.  Felicita Horns, MSN, APRN, FNP-C [facility]

## 2023-12-28 ENCOUNTER — Ambulatory Visit: Payer: Self-pay | Admitting: Family

## 2023-12-28 DIAGNOSIS — N926 Irregular menstruation, unspecified: Secondary | ICD-10-CM

## 2023-12-28 DIAGNOSIS — D259 Leiomyoma of uterus, unspecified: Secondary | ICD-10-CM

## 2023-12-28 DIAGNOSIS — R7989 Other specified abnormal findings of blood chemistry: Secondary | ICD-10-CM

## 2023-12-31 DIAGNOSIS — E538 Deficiency of other specified B group vitamins: Secondary | ICD-10-CM | POA: Insufficient documentation

## 2023-12-31 DIAGNOSIS — F32A Depression, unspecified: Secondary | ICD-10-CM | POA: Insufficient documentation

## 2023-12-31 DIAGNOSIS — N926 Irregular menstruation, unspecified: Secondary | ICD-10-CM | POA: Insufficient documentation

## 2023-12-31 DIAGNOSIS — E88819 Insulin resistance, unspecified: Secondary | ICD-10-CM | POA: Insufficient documentation

## 2023-12-31 LAB — TESTOS,TOTAL,FREE AND SHBG (FEMALE)
Free Testosterone: 5.7 pg/mL (ref 0.1–6.4)
Sex Hormone Binding: 68 nmol/L (ref 17–124)
Testosterone, Total, LC-MS-MS: 55 ng/dL — ABNORMAL HIGH (ref 2–45)

## 2023-12-31 LAB — PROLACTIN: Prolactin: 7.5 ng/mL

## 2023-12-31 NOTE — Assessment & Plan Note (Signed)
 Currently not controlled Discussed options, recommended therapy   I instructed pt to start lexapro  1/2 tablet once daily for 1 week and then increase to a full tablet once daily on week two as tolerated.  We discussed common side effects such as nausea, drowsiness and weight gain.  Also discussed rare but serious side effect of suicidal ideation.  She is instructed to discontinue medication and go directly to ED if this occurs.  Pt verbalizes understanding.  Plan is to follow up in 30 days to evaluate progress.   Hydroxyzine  also as needed

## 2023-12-31 NOTE — Assessment & Plan Note (Signed)
 Ordered b12 pending results

## 2023-12-31 NOTE — Assessment & Plan Note (Signed)
 Workup in place Ordering lab work to include thyroid panel and hormonal R/o hormonal etiology, r/o pcos Ordering tranvsvaginal u/s r/o ovarian cysts, uterine fibroid or less likely neoplasm Pending results.

## 2023-12-31 NOTE — Assessment & Plan Note (Signed)
 Ordered lipid panel, pending results. Work on low cholesterol diet and exercise as tolerated

## 2024-01-15 ENCOUNTER — Ambulatory Visit
Admission: RE | Admit: 2024-01-15 | Discharge: 2024-01-15 | Disposition: A | Discharge status: Home / Self Care | Source: Ambulatory Visit | Attending: Family | Admitting: Family

## 2024-01-15 DIAGNOSIS — N926 Irregular menstruation, unspecified: Secondary | ICD-10-CM | POA: Diagnosis present

## 2024-01-25 ENCOUNTER — Ambulatory Visit: Admitting: Family Medicine

## 2024-01-25 ENCOUNTER — Encounter: Payer: Self-pay | Admitting: Family Medicine

## 2024-01-25 VITALS — BP 112/78 | HR 65 | Temp 98.8°F | Ht 70.5 in | Wt 199.6 lb

## 2024-01-25 DIAGNOSIS — L255 Unspecified contact dermatitis due to plants, except food: Secondary | ICD-10-CM | POA: Insufficient documentation

## 2024-01-25 MED ORDER — PREDNISONE 20 MG PO TABS: ORAL_TABLET | ORAL | 0 refills | Status: DC

## 2024-01-25 NOTE — Assessment & Plan Note (Signed)
 Prednisone with food. Steroid cautions d/w pt.   Can still use triamcinolone if needed.  Can try taking hydroxyzine  if needed for itching, sedation caution.   Keep taking zyrtec.   Update us  as needed.  She agrees.  Lesions appear irritated but don't appear infected,

## 2024-01-25 NOTE — Patient Instructions (Addendum)
 Prednisone with food.  Can still use triamcinolone if needed.  Can try taking hydroxyzine  if needed for itching, sedation caution.   Keep taking zyrtec.   Update us  as needed.  Take care.  Glad to see you.

## 2024-01-25 NOTE — Progress Notes (Signed)
 F/u for possible poison ivy.  Had been picking black berries then later on picked blue berries.  The day after picking blue berries she had a rash, then it started to spread.  [facility]  Not resolved with TAC. Now with B antecubital areas and R superior shoulder and L knee and R forearm.  No FCNAVD.  It itches.    Meds, vitals, and allergies reviewed.   ROS: Per HPI unless specifically indicated in ROS section   Nad ncat Blanching blistering rhus dermatitis, R shoulder B elbows, R forearm, L knee.  Rrr Ctab Skin well perfused.

## 2024-02-05 ENCOUNTER — Ambulatory Visit (INDEPENDENT_AMBULATORY_CARE_PROVIDER_SITE_OTHER): Admitting: Family

## 2024-02-05 ENCOUNTER — Encounter: Payer: Self-pay | Admitting: Family

## 2024-02-05 VITALS — BP 102/66 | HR 70 | Temp 98.0°F | Ht 70.5 in | Wt 202.0 lb

## 2024-02-05 DIAGNOSIS — F32A Depression, unspecified: Secondary | ICD-10-CM

## 2024-02-05 DIAGNOSIS — L255 Unspecified contact dermatitis due to plants, except food: Secondary | ICD-10-CM | POA: Diagnosis not present

## 2024-02-05 DIAGNOSIS — F419 Anxiety disorder, unspecified: Secondary | ICD-10-CM

## 2024-02-05 DIAGNOSIS — L03119 Cellulitis of unspecified part of limb: Secondary | ICD-10-CM | POA: Diagnosis not present

## 2024-02-05 MED ORDER — PREDNISONE 20 MG PO TABS: ORAL_TABLET | ORAL | 0 refills | Status: DC

## 2024-02-05 MED ORDER — CEPHALEXIN 500 MG PO CAPS: 500.0000 mg | ORAL_CAPSULE | Freq: Three times a day (TID) | ORAL | 0 refills | Status: AC

## 2024-02-05 NOTE — Assessment & Plan Note (Signed)
 Doing well on lexapro   Advised to change to night time for s/e of insomnia

## 2024-02-05 NOTE — Progress Notes (Signed)
 Established Patient Office Visit  Subjective:   Patient ID: Stacy Burton, female    DOB: April 23, 1981  Age: 43 y.o. MRN: [id number]  CC:  Chief Complaint  Patient presents with   Rash    Has had poison ivy rash for a couple weeks. Rash started on arms and spread to legs, back, stomach, hips. Was treated by Dr. Cleatus oral prednisone  and topical steroid. Saw improvement while taking oral steroid, finished that Saturday, still using topical. Still having new itchy spots come up.  She is leaving to go out of town tomorrow .    HPI: Stacy Burton is a 43 y.o. female presenting on 02/05/2024 for Rash (Has had poison ivy rash for a couple weeks. Rash started on arms and spread to legs, back, stomach, hips. Was treated by Dr. Cleatus oral prednisone  and topical steroid. Saw improvement while taking oral steroid, finished that Saturday, still using topical. Still having new itchy spots come up. [name] is leaving to go out of town tomorrow .)  Seen 6/23 for rash at urgent care after picking blueberries.  Was given RX for Triamcinolone cream 0.1% for suspected contact dermatitis/poison ivy vs oak  Came back in office again and saw Dr. Cleatus as symptoms not improving and was given prednisone , hydroxyzine .   She states the rash has improved some with the steroids but now beginning to come back and is persistent.   Anxiety, thinks that zyrtec is making her stay awake at night time. She takes this in the am but she does state that it is helping her feel much more calm.       ROS: Negative unless specifically indicated above in HPI.   Relevant past medical history reviewed and updated as indicated.   Allergies and medications reviewed and updated.   Current Outpatient Medications:    cephALEXin  (KEFLEX ) 500 MG capsule, Take 1 capsule (500 mg total) by mouth 3 (three) times daily for 7 days., Disp: 21 capsule, Rfl: 0   Cetirizine HCl (ZYRTEC ALLERGY) 10 MG CAPS, , Disp: , Rfl:    escitalopram   (LEXAPRO ) 10 MG tablet, Take 1 tablet (10 mg total) by mouth daily., Disp: 30 tablet, Rfl: 1   hydrOXYzine  (ATARAX ) 25 MG tablet, Take 1 tablet (25 mg total) by mouth 3 (three) times daily as needed., Disp: 30 tablet, Rfl: 0   predniSONE  (DELTASONE ) 20 MG tablet, Take two tablets once daily for five days, one tablet once daily for five days and then 1/2 tablet once daily for five days, Disp: 18 tablet, Rfl: 0   triamcinolone cream (KENALOG) 0.1 %, Apply 1 Application topically 2 (two) times daily., Disp: , Rfl:   No Known Allergies  Objective:   BP 102/66   Pulse 70   Temp 98 F (36.7 C) (Temporal)   Ht 5' 10.5 (1.791 m)   Wt 202 lb (91.6 kg)   LMP 01/07/2024   SpO2 98%   BMI 28.57 kg/m    Physical Exam Vitals reviewed.  Constitutional:      General: She is not in acute distress.    Appearance: Normal appearance. She is normal weight. She is not ill-appearing, toxic-appearing or diaphoretic.  HENT:     Head: Normocephalic.  Cardiovascular:     Rate and Rhythm: Normal rate.  Pulmonary:     Effort: Pulmonary effort is normal.  Musculoskeletal:        General: Normal range of motion.  Skin:    Comments: Several raised patches of  erythema some with scaly texture  Bil upper ext and left lower ext  Some linear erythematic streaking scattered  Slight area of erythema and very slight yellow discharge left elbow   Neurological:     General: No focal deficit present.     Mental Status: She is alert and oriented to person, place, and time. Mental status is at baseline.  Psychiatric:        Mood and Affect: Mood normal.        Behavior: Behavior normal.        Thought Content: Thought content normal.        Judgment: Judgment normal.     +   Assessment & Plan:  Cellulitis of upper extremity, unspecified laterality -     Cephalexin ; Take 1 capsule (500 mg total) by mouth 3 (three) times daily for 7 days.  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  Hydroxyzine  at night time, continue daily zyrtec.  Rx prednisone  20 mg, two tablets once daily x five days, one tablet every day for five days, 1/2 tablet once daily for five days.  Rx cephalexin  tid x 7 days  Pt advised to:  Please monitor site for worsening signs/symptoms of infection to include: increasing redness, increasing tenderness, increase in size, and or pustulant drainage from site. If this is to occur please let me know immediately.    Orders: -     predniSONE ; Take two tablets once daily for five days, one tablet once daily for five days and then 1/2 tablet once daily for five days  Dispense: 18 tablet; Refill: 0  Anxiety and depression Assessment & Plan: Doing well on lexapro   Advised to change to night time for s/e of insomnia      Follow up plan: Return if symptoms worsen or fail to improve.  Ginger Patrick, FNP

## 2024-02-05 NOTE — Assessment & Plan Note (Signed)
 Persistent contact dermatitis due to plant.  Hydroxyzine  at night time, continue daily zyrtec.  Rx prednisone  20 mg, two tablets once daily x five days, one tablet every day for five days, 1/2 tablet once daily for five days.  Rx cephalexin  tid x 7 days  Pt advised to:  Please monitor site for worsening signs/symptoms of infection to include: increasing redness, increasing tenderness, increase in size, and or pustulant drainage from site. If this is to occur please let me know immediately.

## 2024-02-16 ENCOUNTER — Other Ambulatory Visit: Payer: Self-pay | Admitting: Family

## 2024-02-16 DIAGNOSIS — F32A Depression, unspecified: Secondary | ICD-10-CM

## 2024-04-18 ENCOUNTER — Ambulatory Visit: Admitting: Family Medicine

## 2024-04-18 ENCOUNTER — Encounter: Payer: Self-pay | Admitting: Family Medicine

## 2024-04-18 VITALS — BP 114/77 | HR 62 | Ht 70.0 in | Wt 204.0 lb

## 2024-04-18 DIAGNOSIS — N926 Irregular menstruation, unspecified: Secondary | ICD-10-CM | POA: Diagnosis not present

## 2024-04-18 DIAGNOSIS — D259 Leiomyoma of uterus, unspecified: Secondary | ICD-10-CM | POA: Insufficient documentation

## 2024-04-18 DIAGNOSIS — D251 Intramural leiomyoma of uterus: Secondary | ICD-10-CM | POA: Diagnosis not present

## 2024-04-18 DIAGNOSIS — Z809 Family history of malignant neoplasm, unspecified: Secondary | ICD-10-CM | POA: Diagnosis not present

## 2024-04-18 NOTE — Progress Notes (Addendum)
 Subjective:    Patient ID: Stacy Burton is a 43 y.o. female presenting with Menstrual Problem  on 04/18/2024  HPI: [id number] referred for irregular cycles. S/p 2 previous C-sections with BTL with last birth. Menarche at age 64. Sporadic menstrual cycles after this. On OC's since age 46. Had regular cycles on COC's until trying to have babies.  Tracking off COC's 60-90 days. Clomid with twins. Not on birth control. Nursing for a long time. Since last birth, cycles are irregular 45-60 days. PCP did work-up and noted to have one fibroid int the fundus at 2cm. Then also noted to have high testosterone but free testosterone is WNL. Prolactin and FSH are WNL.  Has heavy cycles but not changed. Does not want birth control. Has FH endometrial cancer in her mother (unsure of hx related to this, needed hyst at age 32) Has been on anxiety meds x 4 months and her cycles are now regular. Has [facility] as primary provider and has mammogram and pap smear with them, in the past year. Will attempt to obtain records.  Review of Systems  Constitutional:  Negative for chills and fever.  Respiratory:  Negative for shortness of breath.   Cardiovascular:  Negative for chest pain.  Gastrointestinal:  Negative for abdominal pain, nausea and vomiting.  Genitourinary:  Negative for dysuria.  Skin:  Negative for rash.      Objective:    BP 114/77   Pulse 62   Ht 5' 10 (1.778 m)   Wt 204 lb (92.5 kg)   LMP 04/08/2024   BMI 29.27 kg/m  Physical Exam Exam conducted with a chaperone present.  Constitutional:      General: She is not in acute distress.    Appearance: She is well-developed.  HENT:     Head: Normocephalic and atraumatic.  Eyes:     General: No scleral icterus. Cardiovascular:     Rate and Rhythm: Normal rate.  Pulmonary:     Effort: Pulmonary effort is normal.  Abdominal:     Palpations: Abdomen is soft.  Musculoskeletal:     Cervical back: Neck supple.  Skin:    General: Skin is warm  and dry.  Neurological:     Mental Status: She is alert and oriented to person, place, and time.         Assessment & Plan:   Problem List Items Addressed This Visit       Unprioritized   Irregular periods/menstrual cycles - Primary   Improved with lexapro . Does not obviously have PCOS, but did discuss how/why this happens, concerns for going a long time between cycles and risks of endometrial cancer. Discussed need for clomid and how this helped her ovulate. Reviewed PCOS and how it works. Lexapro , does change hypothalamic/pituitary changes with Lexapro  and how this might regulate her cycles. Discussed menopause and how to tell the difference in this. Cycles are mildly heavy, but adenomyosis or fibroid may cause this. We might need sampling and discussed this.      Fibroid uterus   At fundus. Discussed bulk symptoms, size of fibroid, location, dysmenorrhea, menorrhagia, and commonness of this. Likely ok for now, and not causing worrisome symptoms.      Family history of endometrial cancer in mother   To obtain history from mom. Discussed changes in cycles and when to sample endometrium. Discussed prevention options to include cyclic progesterone, daily progesterone, Progesterone containing IUD or hysterectomy if needed. She is currently happy with her cycles and we  will revisit in 6 months if something changes.        Return in about 6 months (around 10/16/2024).  Glenys GORMAN Birk, MD 04/18/2024 8:22 AM

## 2024-04-18 NOTE — Assessment & Plan Note (Signed)
 At fundus. Discussed bulk symptoms, size of fibroid, location, dysmenorrhea, menorrhagia, and commonness of this. Likely ok for now, and not causing worrisome symptoms.

## 2024-04-18 NOTE — Assessment & Plan Note (Addendum)
 Improved with lexapro . Does not obviously have PCOS, but did discuss how/why this happens, concerns for going a long time between cycles and risks of endometrial cancer. Discussed need for clomid and how this helped her ovulate. Reviewed PCOS and how it works. Lexapro , does change hypothalamic/pituitary changes with Lexapro  and how this might regulate her cycles. Discussed menopause and how to tell the difference in this. Cycles are mildly heavy, but adenomyosis or fibroid may cause this. We might need sampling and discussed this.

## 2024-04-18 NOTE — Assessment & Plan Note (Addendum)
 To obtain history from mom. Discussed changes in cycles and when to sample endometrium. Discussed prevention options to include cyclic progesterone, daily progesterone, Progesterone containing IUD or hysterectomy if needed. She is currently happy with her cycles and we will revisit in 6 months if something changes.

## 2024-04-18 NOTE — Progress Notes (Signed)
 New GYN here for consult f/u on fibroid management had U/S 01/15/24 notes recent labs showed elevated testosterone.  Since starting anxiety Rx has had normal periods for the last 4 months.  LMP:04/08/24 lasting 5 days heavy - moderate flow Last Pap: unsure  Mammogram: per pt last fall last done at [facility]. Flu Vaccine: Declines
